# Patient Record
Sex: Female | Born: 1945 | Race: White | Hispanic: No | Marital: Married | Smoking: Former smoker
Health system: Southern US, Community
[De-identification: ages and names within clinical notes are randomized; demographics above are authoritative.]

## PROBLEM LIST (undated history)

## (undated) DIAGNOSIS — E785 Hyperlipidemia, unspecified: Secondary | ICD-10-CM

## (undated) DIAGNOSIS — I6529 Occlusion and stenosis of unspecified carotid artery: Secondary | ICD-10-CM

## (undated) DIAGNOSIS — M810 Age-related osteoporosis without current pathological fracture: Secondary | ICD-10-CM

## (undated) DIAGNOSIS — Z8659 Personal history of other mental and behavioral disorders: Secondary | ICD-10-CM

## (undated) DIAGNOSIS — I10 Essential (primary) hypertension: Secondary | ICD-10-CM

## (undated) HISTORY — PX: LEG SURGERY: SHX1003

## (undated) HISTORY — PX: ABDOMINAL HYSTERECTOMY: SHX81

## (undated) HISTORY — PX: FOOT SURGERY: SHX648

## (undated) HISTORY — PX: TONSILLECTOMY: SUR1361

## (undated) HISTORY — PX: CHOLECYSTECTOMY: SHX55

## (undated) HISTORY — DX: Essential (primary) hypertension: I10

## (undated) HISTORY — DX: Occlusion and stenosis of unspecified carotid artery: I65.29

## (undated) HISTORY — PX: WRIST SURGERY: SHX841

## (undated) HISTORY — DX: Hyperlipidemia, unspecified: E78.5

---

## 2011-04-05 ENCOUNTER — Ambulatory Visit: Payer: Self-pay | Admitting: Ophthalmology

## 2011-04-19 ENCOUNTER — Ambulatory Visit: Payer: Self-pay | Admitting: Ophthalmology

## 2011-10-30 DIAGNOSIS — F32A Depression, unspecified: Secondary | ICD-10-CM | POA: Insufficient documentation

## 2011-10-30 DIAGNOSIS — M810 Age-related osteoporosis without current pathological fracture: Secondary | ICD-10-CM | POA: Insufficient documentation

## 2012-09-17 DIAGNOSIS — B351 Tinea unguium: Secondary | ICD-10-CM | POA: Insufficient documentation

## 2013-06-09 DIAGNOSIS — J209 Acute bronchitis, unspecified: Secondary | ICD-10-CM | POA: Insufficient documentation

## 2013-06-10 DIAGNOSIS — F331 Major depressive disorder, recurrent, moderate: Secondary | ICD-10-CM | POA: Insufficient documentation

## 2013-09-10 DIAGNOSIS — J301 Allergic rhinitis due to pollen: Secondary | ICD-10-CM | POA: Insufficient documentation

## 2013-09-10 DIAGNOSIS — I10 Essential (primary) hypertension: Secondary | ICD-10-CM | POA: Insufficient documentation

## 2013-09-10 DIAGNOSIS — Z6828 Body mass index (BMI) 28.0-28.9, adult: Secondary | ICD-10-CM | POA: Insufficient documentation

## 2013-09-10 DIAGNOSIS — G43019 Migraine without aura, intractable, without status migrainosus: Secondary | ICD-10-CM | POA: Insufficient documentation

## 2013-09-10 DIAGNOSIS — E538 Deficiency of other specified B group vitamins: Secondary | ICD-10-CM | POA: Insufficient documentation

## 2013-12-29 DIAGNOSIS — R233 Spontaneous ecchymoses: Secondary | ICD-10-CM | POA: Insufficient documentation

## 2014-01-13 DIAGNOSIS — M1710 Unilateral primary osteoarthritis, unspecified knee: Secondary | ICD-10-CM | POA: Insufficient documentation

## 2014-01-13 DIAGNOSIS — M179 Osteoarthritis of knee, unspecified: Secondary | ICD-10-CM | POA: Insufficient documentation

## 2014-11-09 DIAGNOSIS — Z9181 History of falling: Secondary | ICD-10-CM | POA: Insufficient documentation

## 2014-11-17 DIAGNOSIS — N390 Urinary tract infection, site not specified: Secondary | ICD-10-CM | POA: Insufficient documentation

## 2014-11-17 DIAGNOSIS — F29 Unspecified psychosis not due to a substance or known physiological condition: Secondary | ICD-10-CM | POA: Insufficient documentation

## 2014-11-30 DIAGNOSIS — M84353A Stress fracture, unspecified femur, initial encounter for fracture: Secondary | ICD-10-CM | POA: Insufficient documentation

## 2014-12-04 DIAGNOSIS — M25532 Pain in left wrist: Secondary | ICD-10-CM | POA: Insufficient documentation

## 2014-12-15 DIAGNOSIS — L821 Other seborrheic keratosis: Secondary | ICD-10-CM | POA: Insufficient documentation

## 2015-05-10 DIAGNOSIS — S72302A Unspecified fracture of shaft of left femur, initial encounter for closed fracture: Secondary | ICD-10-CM | POA: Insufficient documentation

## 2015-05-20 DIAGNOSIS — R339 Retention of urine, unspecified: Secondary | ICD-10-CM | POA: Insufficient documentation

## 2015-05-20 DIAGNOSIS — N398 Other specified disorders of urinary system: Secondary | ICD-10-CM | POA: Insufficient documentation

## 2015-05-20 DIAGNOSIS — N393 Stress incontinence (female) (male): Secondary | ICD-10-CM | POA: Insufficient documentation

## 2015-07-14 DIAGNOSIS — S82831A Other fracture of upper and lower end of right fibula, initial encounter for closed fracture: Secondary | ICD-10-CM | POA: Insufficient documentation

## 2015-08-17 DIAGNOSIS — S82032A Displaced transverse fracture of left patella, initial encounter for closed fracture: Secondary | ICD-10-CM | POA: Insufficient documentation

## 2015-09-13 DIAGNOSIS — I491 Atrial premature depolarization: Secondary | ICD-10-CM | POA: Insufficient documentation

## 2015-10-18 DIAGNOSIS — Z79899 Other long term (current) drug therapy: Secondary | ICD-10-CM | POA: Insufficient documentation

## 2015-12-02 DIAGNOSIS — R748 Abnormal levels of other serum enzymes: Secondary | ICD-10-CM | POA: Insufficient documentation

## 2016-07-03 DIAGNOSIS — L578 Other skin changes due to chronic exposure to nonionizing radiation: Secondary | ICD-10-CM | POA: Insufficient documentation

## 2017-11-22 ENCOUNTER — Ambulatory Visit (INDEPENDENT_AMBULATORY_CARE_PROVIDER_SITE_OTHER): Payer: Medicare Other | Admitting: Vascular Surgery

## 2017-11-22 ENCOUNTER — Encounter (INDEPENDENT_AMBULATORY_CARE_PROVIDER_SITE_OTHER): Payer: Self-pay

## 2017-11-22 ENCOUNTER — Encounter (INDEPENDENT_AMBULATORY_CARE_PROVIDER_SITE_OTHER): Payer: Self-pay | Admitting: Vascular Surgery

## 2017-11-22 VITALS — BP 108/66 | HR 81 | Resp 17 | Ht 66.0 in | Wt 170.0 lb

## 2017-11-22 DIAGNOSIS — I6523 Occlusion and stenosis of bilateral carotid arteries: Secondary | ICD-10-CM | POA: Insufficient documentation

## 2017-11-22 DIAGNOSIS — E785 Hyperlipidemia, unspecified: Secondary | ICD-10-CM | POA: Diagnosis not present

## 2017-11-22 NOTE — Progress Notes (Signed)
Subjective:    Patient ID: ZYAIR RHEIN, female    DOB: 11/07/1946, 71 y.o.   MRN: [id number] Chief Complaint  Patient presents with  . New Patient (Initial Visit)    Carotid Stenosis   Presents as a new patient referred by [facility] primary care in my been for the evaluation of carotid artery stenosis.  The patient endorses a history of both her parents experiencing strokes.  The patient was concerned done during a physical examination a provider auscultated a bruit. This led to a CTA of the neck which as per the patient was notable for 65% stenosis to the bilateral arteries.  The patient states her stenosis was characterized as "beadlike".  The patient was placed on a cholesterol medication and aspirin and encouraged to follow-up in 1 year.  The vascular surgeon has since moved on from [facility] and the patient is now establishing care with [place].  The patient is asymptomatic and denies any amaurosis fugax or neurological deficits.  We called the patient's primary care office and asked for a copy of the CTA to be sent to our office as I was not able to access it in epic.  Patient denies any fever, nausea vomiting.   Review of Systems  Constitutional: Negative.   HENT: Negative.   Eyes: Negative.   Respiratory: Negative.   Cardiovascular:       Carotid stenosis  Gastrointestinal: Negative.   Endocrine: Negative.   Genitourinary: Negative.   Musculoskeletal: Negative.   Skin: Negative.   Allergic/Immunologic: Negative.   Neurological: Negative.   Hematological: Negative.   Psychiatric/Behavioral: Negative.       Objective:   Physical Exam  Constitutional: She is oriented to person, place, and time. She appears well-developed. No distress.  HENT:  Head: Normocephalic and atraumatic.  Eyes: Conjunctivae are normal. Pupils are equal, round, and reactive to light.  Neck: Normal range of motion.  Bilateral bruit noted  Cardiovascular: Normal rate, regular rhythm, normal heart sounds and intact  distal pulses.  Pulses:      Radial pulses are 2+ on the right side, and 2+ on the left side.  Pulmonary/Chest: Effort normal and breath sounds normal.  Musculoskeletal: Normal range of motion. She exhibits no edema.  Neurological: She is alert and oriented to person, place, and time.  Skin: Skin is warm and dry. She is not diaphoretic.  Psychiatric: She has a normal mood and affect. Her behavior is normal. Judgment and thought content normal.  Vitals reviewed.  BP 108/66 (BP Location: Right Arm)   Pulse 81   Resp 17   Ht 5\' 6"  (1.676 m)   Wt 170 lb (77.1 kg)   BMI 27.44 kg/m   No past medical history on file.  Social History   Socioeconomic History  . Marital status: Married    Spouse name: Not on file  . Number of children: Not on file  . Years of education: Not on file  . Highest education level: Not on file  Social Needs  . Financial resource strain: Not on file  . Food insecurity - worry: Not on file  . Food insecurity - inability: Not on file  . Transportation needs - medical: Not on file  . Transportation needs - non-medical: Not on file  Occupational History  . Not on file  Tobacco Use  . Smoking status: Never Smoker  . Smokeless tobacco: Never Used  Substance and Sexual Activity  . Alcohol use: Not on file  . Drug use:  Not on file  . Sexual activity: Not on file  Other Topics Concern  . Not on file  Social History Narrative  . Not on file   No family history on file.  Allergies  Allergen Reactions  . Atorvastatin     Other reaction(s): Muscle Pain  . Risperidone Other (See Comments)    Other reaction(s): Other (See Comments) Hallucinations Other Reaction: Hallucinations Hallucinations Other Reaction: Hallucinations Hallucinations   . Diclofenac Other (See Comments)    Other reaction(s): Other (See Comments) Other Reaction: patch = nausea Other Reaction: patch = nausea   . Prednisone Other (See Comments)    Other reaction(s): Other (See  Comments) Mood Change Mood Change Anxiety   . Codeine Itching  . Rizatriptan Palpitations      Assessment & Plan:  Presents as a new patient referred by [facility] primary care in my been for the evaluation of carotid artery stenosis.  The patient endorses a history of both her parents experiencing strokes.  The patient was concerned done during a physical examination a provider auscultated a bruit. This led to a CTA of the neck which as per the patient was notable for 65% stenosis to the bilateral arteries.  The patient states her stenosis was characterized as "beadlike".  The patient was placed on a cholesterol medication and aspirin and encouraged to follow-up in 1 year.  [facility] and the patient is now establishing care with us.  The patient is asymptomatic and denies any amaurosis fugax or neurological deficits.  We called the patient's primary care office and asked for a copy of the CTA to be sent to our office as I was not able to access it in epic.  Patient denies any fever, nausea vomiting.  1. Bilateral carotid artery stenosis - New Patient with a diagnosis of bilateral carotid artery stenosis. [facility] however the vascular surgeon is no longer there The patient is now establishing care with us The CTA results were not included in the patient's referral paperwork and I was unable to obtain them on epic A call was placed to the patient's primary care office to have a CTA faxed over The CTA of the neck was conducted approximately 1 year ago and I find it reasonable to order an ultrasound to assess for any worsening carotid artery disease The patient to follow-up with me or the physician after her carotid duplex  - VAS US CAROTID; Future  2. Hyperlipidemia, unspecified hyperlipidemia type - Stable Encouraged good control as its slows the progression of atherosclerotic disease  Current Outpatient  Medications on File Prior to Visit  Medication Sig Dispense Refill  . aspirin EC 81 MG tablet Take by mouth.    . Calcium Ascorbate 500 MG TABS Take by mouth.    . Calcium Carbonate-Vitamin D (CALCIUM 500/D PO) Take 500 mg by mouth.    Marland Kitchen. CALCIUM PO Take by mouth.    . clorazepate (TRANXENE) 15 MG tablet 1 tab by mouth 3 times a day & 2 tab at bedtime    . clotrimazole (LOTRIMIN) 1 % cream     . Cranberry 450 MG TABS Take by mouth.    . cyanocobalamin (,VITAMIN B-12,) 1000 MCG/ML injection Inject into the muscle.    . cyclobenzaprine (FLEXERIL) 10 MG tablet Take 10 mg by mouth.    . Diethylpropion HCl CR 75 MG TB24 Take 1 tablet by  mouth 3 (three) times daily.  5  . Ergocalciferol (VITAMIN D2) 2000 units TABS Take by mouth.    . escitalopram (LEXAPRO) 20 MG tablet Take by mouth.    . estradiol (ESTRACE) 0.1 MG/GM vaginal cream Place vaginally.    . fexofenadine (ALLEGRA) 180 MG tablet Take by mouth.    . fluticasone (FLONASE) 50 MCG/ACT nasal spray Place into the nose.    . lactulose (CHRONULAC) 10 GM/15ML solution prn as needed    . lamoTRIgine (LAMICTAL) 100 MG tablet     . lamoTRIgine (LAMICTAL) 200 MG tablet Take 200 mg by mouth.    . magnesium oxide (MAG-OX) 400 MG tablet Take by mouth.    . meclizine (ANTIVERT) 25 MG tablet Take by mouth.    . naproxen (NAPROSYN) 500 MG tablet Take 500 mg by mouth.    Marland Kitchen. omeprazole (PRILOSEC) 20 MG capsule Take by mouth.    . ondansetron (ZOFRAN) 4 MG tablet Take by mouth.    . oxybutynin (DITROPAN-XL) 10 MG 24 hr tablet Take by mouth.    . promethazine (PHENERGAN) 25 MG tablet Take by mouth.    . ramelteon (ROZEREM) 8 MG tablet Reported on 04/03/2016    . risperiDONE (RISPERDAL) 0.25 MG tablet Take by mouth.    . simvastatin (ZOCOR) 20 MG tablet Take by mouth.    . Teriparatide, Recombinant, (FORTEO) 600 MCG/2.4ML SOLN Inject into the skin.    Marland Kitchen. ziprasidone (GEODON) 40 MG capsule 80 mg. Pt takes 2 tablets daily    . zolpidem (AMBIEN) 10 MG tablet  Take 20 mg by mouth.    . zonisamide (ZONEGRAN) 100 MG capsule 2 (two) times daily.    Marland Kitchen. amphetamine-dextroamphetamine (ADDERALL) 30 MG tablet Take 30 mg by mouth.     No current facility-administered medications on file prior to visit.    There are no Patient Instructions on file for this visit. No Follow-up on file.  Whit Bruni A Mattias Walmsley, PA-C

## 2017-11-28 ENCOUNTER — Encounter (INDEPENDENT_AMBULATORY_CARE_PROVIDER_SITE_OTHER): Payer: Self-pay

## 2017-12-27 ENCOUNTER — Ambulatory Visit (INDEPENDENT_AMBULATORY_CARE_PROVIDER_SITE_OTHER): Payer: Medicare Other

## 2017-12-27 ENCOUNTER — Ambulatory Visit (INDEPENDENT_AMBULATORY_CARE_PROVIDER_SITE_OTHER): Payer: Medicare Other | Admitting: Vascular Surgery

## 2017-12-27 ENCOUNTER — Encounter (INDEPENDENT_AMBULATORY_CARE_PROVIDER_SITE_OTHER): Payer: Self-pay | Admitting: Vascular Surgery

## 2017-12-27 VITALS — BP 124/76 | HR 69 | Resp 14 | Ht 66.0 in | Wt 172.0 lb

## 2017-12-27 DIAGNOSIS — K219 Gastro-esophageal reflux disease without esophagitis: Secondary | ICD-10-CM

## 2017-12-27 DIAGNOSIS — E785 Hyperlipidemia, unspecified: Secondary | ICD-10-CM

## 2017-12-27 DIAGNOSIS — I6523 Occlusion and stenosis of bilateral carotid arteries: Secondary | ICD-10-CM

## 2017-12-27 NOTE — Progress Notes (Signed)
MRN : [id number]  Andrea RuddleMyra O Sims is a 72 y.o. (09/24/1946) female who presents with chief complaint of  Chief Complaint  Patient presents with  . Follow-up    Carotid u/s f/u  .  History of Present Illness: The patient is seen for follow up evaluation of carotid stenosis. The carotid stenosis followed by ultrasound.   The patient denies amaurosis fugax. There is no recent history of TIA symptoms or focal motor deficits. There is no prior documented CVA.  The patient is taking enteric-coated aspirin 81 mg daily.  There is no history of migraine headaches. There is no history of seizures.  The patient has a history of coronary artery disease, no recent episodes of angina or shortness of breath. The patient denies PAD or claudication symptoms. There is a history of hyperlipidemia which is being treated with a statin.      Current Meds  Medication Sig  . amphetamine-dextroamphetamine (ADDERALL) 30 MG tablet Take 30 mg by mouth.  Marland Kitchen. aspirin EC 81 MG tablet Take by mouth.  . Calcium Carbonate-Vitamin D (CALCIUM 500/D PO) Take 500 mg by mouth.  . clorazepate (TRANXENE) 15 MG tablet 1 tab by mouth 3 times a day & 2 tab at bedtime  . clotrimazole (LOTRIMIN) 1 % cream   . Cranberry 450 MG TABS Take by mouth.  . cyanocobalamin (,VITAMIN B-12,) 1000 MCG/ML injection Inject into the muscle.  . cyclobenzaprine (FLEXERIL) 10 MG tablet Take 10 mg by mouth.  . diclofenac sodium (VOLTAREN) 1 % GEL APPLY TOPICALLY ON LEG FOUR TIMES A DAY AS NEEDED  . Diethylpropion HCl CR 75 MG TB24 Take 1 tablet by mouth 3 (three) times daily.  . Ergocalciferol (VITAMIN D2) 2000 units TABS Take by mouth.  . escitalopram (LEXAPRO) 20 MG tablet Take by mouth.  . estradiol (ESTRACE) 0.1 MG/GM vaginal cream Place vaginally.  . fexofenadine (ALLEGRA) 180 MG tablet Take by mouth.  . fluticasone (FLONASE) 50 MCG/ACT nasal spray Place into the nose.  . lactulose (CHRONULAC) 10 GM/15ML solution prn as needed  .  lamoTRIgine (LAMICTAL) 100 MG tablet   . magnesium oxide (MAG-OX) 400 MG tablet Take by mouth.  . naproxen (NAPROSYN) 500 MG tablet Take 500 mg by mouth.  . ondansetron (ZOFRAN) 4 MG tablet Take by mouth.  Marland Kitchen. PROCTOSOL HC 2.5 % rectal cream APPLY TO AFFECTED AREA 3 TIMES A DAY  . promethazine (PHENERGAN) 25 MG tablet Take by mouth.  . ramelteon (ROZEREM) 8 MG tablet Reported on 04/03/2016  . risperiDONE (RISPERDAL) 0.25 MG tablet Take by mouth.  . simvastatin (ZOCOR) 20 MG tablet Take by mouth.  . Teriparatide, Recombinant, (FORTEO) 600 MCG/2.4ML SOLN Inject into the skin.  Marland Kitchen. ziprasidone (GEODON) 40 MG capsule 80 mg. Pt takes 2 tablets daily  . zolpidem (AMBIEN) 10 MG tablet Take 20 mg by mouth.  . zonisamide (ZONEGRAN) 100 MG capsule 2 (two) times daily.  . [DISCONTINUED] Calcium Ascorbate 500 MG TABS Take by mouth.  . [DISCONTINUED] CALCIUM PO Take by mouth.  . [DISCONTINUED] lamoTRIgine (LAMICTAL) 200 MG tablet Take 200 mg by mouth.    Past Medical History:  Diagnosis Date  . Carotid artery occlusion   . Hyperlipidemia   . Hypertension     No past surgical history on file.  Social History Social History   Tobacco Use  . Smoking status: Never Smoker  . Smokeless tobacco: Never Used  Substance Use Topics  . Alcohol use: Not on file  . Drug use:  Not on file    Family History No family history on file.  Allergies  Allergen Reactions  . Atorvastatin     Other reaction(s): Muscle Pain  . Risperidone Other (See Comments)    Other reaction(s): Other (See Comments) Hallucinations Other Reaction: Hallucinations Hallucinations Other Reaction: Hallucinations Hallucinations   . Diclofenac Other (See Comments)    Other reaction(s): Other (See Comments) Other Reaction: patch = nausea Other Reaction: patch = nausea   . Prednisone Other (See Comments)    Other reaction(s): Other (See Comments) Mood Change Mood Change Anxiety   . Codeine Itching  . Rizatriptan  Palpitations     REVIEW OF SYSTEMS (Negative unless checked)  Constitutional: [] Weight loss  [] Fever  [] Chills Cardiac: [] Chest pain   [] Chest pressure   [] Palpitations   [] Shortness of breath when laying flat   [] Shortness of breath with exertion. Vascular:  [] Pain in legs with walking   [] Pain in legs at rest  [] History of DVT   [] Phlebitis   [] Swelling in legs   [] Varicose veins   [] Non-healing ulcers Pulmonary:   [] Uses home oxygen   [] Productive cough   [] Hemoptysis   [] Wheeze  [] COPD   [] Asthma Neurologic:  [] Dizziness   [] Seizures   [] History of stroke   [] History of TIA  [] Aphasia   [] Vissual changes   [] Weakness or numbness in arm   [] Weakness or numbness in leg Musculoskeletal:   [] Joint swelling   [] Joint pain   [] Low back pain Hematologic:  [] Easy bruising  [] Easy bleeding   [] Hypercoagulable state   [] Anemic Gastrointestinal:  [] Diarrhea   [] Vomiting  [] Gastroesophageal reflux/heartburn   [] Difficulty swallowing. Genitourinary:  [] Chronic kidney disease   [] Difficult urination  [] Frequent urination   [] Blood in urine Skin:  [] Rashes   [] Ulcers  Psychological:  [] History of anxiety   []  History of major depression.  Physical Examination  Vitals:   12/27/17 1445 12/27/17 1446  BP: 123/73 124/76  Pulse: 71 69  Resp: 14   Weight: 172 lb (78 kg)   Height: 5\' 6"  (1.676 m)    Body mass index is 27.76 kg/m. Gen: WD/WN, NAD Head: [place]/AT, No temporalis wasting.  Ear/Nose/Throat: Hearing grossly intact, nares w/o erythema or drainage Eyes: PER, EOMI, sclera nonicteric.  Neck: Supple, no large masses.   Pulmonary:  Good air movement, no audible wheezing bilaterally, no use of accessory muscles.  Cardiac: RRR, no JVD Vascular: no bruits Vessel Right Left  Radial Palpable Palpable  Ulnar Palpable Palpable  Brachial Palpable Palpable  Carotid Palpable Palpable  Gastrointestinal: Non-distended. No guarding/no peritoneal signs.  Musculoskeletal: M/S 5/5 throughout.  No  deformity or atrophy.  Neurologic: CN 2-12 intact. Symmetrical.  Speech is fluent. Motor exam as listed above. Psychiatric: Judgment intact, Mood & affect appropriate for pt's clinical situation. Dermatologic: No rashes or ulcers noted.  No changes consistent with cellulitis. Lymph : No lichenification or skin changes of chronic lymphedema.  CBC No results found for: WBC, HGB, HCT, MCV, PLT  BMET No results found for: NA, K, CL, CO2, GLUCOSE, BUN, CREATININE, CALCIUM, GFRNONAA, GFRAA CrCl cannot be calculated (No order found.).  COAG No results found for: INR, PROTIME  Radiology No results found.   Assessment/Plan 1. Bilateral carotid artery stenosis Recommend:  Given the patient's asymptomatic subcritical stenosis no further invasive testing or surgery at this time.  Duplex ultrasound shows <30% stenosis bilaterally.  Continue antiplatelet therapy as prescribed Continue management of CAD, HTN and Hyperlipidemia Healthy heart diet,  encouraged exercise at  least 4 times per week Follow up in 12 months with duplex ultrasound and physical exam based on <50% stenosis of the bilateral carotid artery   2. Hyperlipidemia, unspecified hyperlipidemia type Continue antihypertensive medications as already ordered, these medications have been reviewed and there are no changes at this time.   3. Gastroesophageal reflux disease without esophagitis Continue PPI as already ordered, these medications have been reviewed and there are no changes at this time.     Levora Dredge, MD  12/27/2017 3:36 PM

## 2018-12-30 ENCOUNTER — Ambulatory Visit (INDEPENDENT_AMBULATORY_CARE_PROVIDER_SITE_OTHER): Payer: Medicare Other | Admitting: Vascular Surgery

## 2018-12-30 ENCOUNTER — Encounter (INDEPENDENT_AMBULATORY_CARE_PROVIDER_SITE_OTHER): Payer: Medicare Other

## 2019-09-16 DIAGNOSIS — G5602 Carpal tunnel syndrome, left upper limb: Secondary | ICD-10-CM | POA: Insufficient documentation

## 2019-09-16 DIAGNOSIS — S52502A Unspecified fracture of the lower end of left radius, initial encounter for closed fracture: Secondary | ICD-10-CM | POA: Insufficient documentation

## 2020-01-27 ENCOUNTER — Other Ambulatory Visit: Payer: Self-pay | Admitting: Family Medicine

## 2020-01-27 DIAGNOSIS — I6523 Occlusion and stenosis of bilateral carotid arteries: Secondary | ICD-10-CM

## 2020-02-05 ENCOUNTER — Ambulatory Visit: Payer: Medicare Other

## 2020-02-18 ENCOUNTER — Ambulatory Visit: Payer: Medicare Other

## 2020-03-08 ENCOUNTER — Other Ambulatory Visit: Payer: Self-pay

## 2020-03-08 ENCOUNTER — Encounter (INDEPENDENT_AMBULATORY_CARE_PROVIDER_SITE_OTHER): Payer: Self-pay | Admitting: Vascular Surgery

## 2020-03-08 ENCOUNTER — Ambulatory Visit (INDEPENDENT_AMBULATORY_CARE_PROVIDER_SITE_OTHER): Payer: Medicare Other | Admitting: Vascular Surgery

## 2020-03-08 VITALS — BP 115/73 | HR 89 | Resp 16 | Ht 65.5 in | Wt 179.6 lb

## 2020-03-08 DIAGNOSIS — M545 Low back pain, unspecified: Secondary | ICD-10-CM | POA: Insufficient documentation

## 2020-03-08 DIAGNOSIS — E785 Hyperlipidemia, unspecified: Secondary | ICD-10-CM

## 2020-03-08 DIAGNOSIS — M542 Cervicalgia: Secondary | ICD-10-CM | POA: Insufficient documentation

## 2020-03-08 DIAGNOSIS — K219 Gastro-esophageal reflux disease without esophagitis: Secondary | ICD-10-CM

## 2020-03-08 DIAGNOSIS — I6523 Occlusion and stenosis of bilateral carotid arteries: Secondary | ICD-10-CM | POA: Diagnosis not present

## 2020-03-08 DIAGNOSIS — M25569 Pain in unspecified knee: Secondary | ICD-10-CM | POA: Insufficient documentation

## 2020-03-08 DIAGNOSIS — S92309A Fracture of unspecified metatarsal bone(s), unspecified foot, initial encounter for closed fracture: Secondary | ICD-10-CM | POA: Insufficient documentation

## 2020-03-08 NOTE — Progress Notes (Signed)
MRN : [id number]  Andrea Sims is a 74 y.o. (Mar 18, 1946) female who presents with chief complaint of  Chief Complaint  Patient presents with  . Follow-up    ref Andrea Sims carotid stenosis  .  History of Present Illness:   The patient is seen for follow up evaluation of carotid stenosis. The carotid stenosis followed by ultrasound.   The patient denies amaurosis fugax. There is no recent history of TIA symptoms or focal motor deficits. There is no prior documented CVA.  The patient is taking enteric-coated aspirin 81 mg daily.  There is no history of migraine headaches. There is no history of seizures.  The patient has a history of coronary artery disease, no recent episodes of angina or shortness of breath. The patient denies PAD or claudication symptoms. There is a history of hyperlipidemia which is being treated with a statin.   Previous duplex ultrasound of the carotid arteries shows <40% bilateral ICA stenosis, tortuous ICA noted.  No outpatient medications have been marked as taking for the 03/08/20 encounter (Office Visit) with Andrea Sims, Andrea Craver, MD.    Past Medical History:  Diagnosis Date  . Carotid artery occlusion   . Hyperlipidemia   . Hypertension     No past surgical history on file.  Social History Social History   Tobacco Use  . Smoking status: Never Smoker  . Smokeless tobacco: Never Used  Substance Use Topics  . Alcohol use: Not on file  . Drug use: Not on file    Family History No family history on file.  Allergies  Allergen Reactions  . Atorvastatin     Other reaction(s): Muscle Pain  . Risperidone Other (See Comments)    Other reaction(s): Other (See Comments) Hallucinations Other Reaction: Hallucinations Hallucinations Other Reaction: Hallucinations Hallucinations   . Diclofenac Other (See Comments)    Other reaction(s): Other (See Comments) Other Reaction: patch = nausea Other Reaction: patch = nausea   . Prednisone Other  (See Comments)    Other reaction(s): Other (See Comments) Mood Change Mood Change Anxiety   . Codeine Itching  . Rizatriptan Palpitations     REVIEW OF SYSTEMS (Negative unless checked)  Constitutional: [] Weight loss  [] Fever  [] Chills Cardiac: [] Chest pain   [] Chest pressure   [] Palpitations   [] Shortness of breath when laying flat   [] Shortness of breath with exertion. Vascular:  [] Pain in legs with walking   [] Pain in legs at rest  [] History of DVT   [] Phlebitis   [] Swelling in legs   [] Varicose veins   [] Non-healing ulcers Pulmonary:   [] Uses home oxygen   [] Productive cough   [] Hemoptysis   [] Wheeze  [] COPD   [] Asthma Neurologic:  [] Dizziness   [] Seizures   [] History of stroke   [] History of TIA  [] Aphasia   [] Vissual changes   [] Weakness or numbness in arm   [] Weakness or numbness in leg Musculoskeletal:   [] Joint swelling   [] Joint pain   [] Low back pain Hematologic:  [] Easy bruising  [] Easy bleeding   [] Hypercoagulable state   [] Anemic Gastrointestinal:  [] Diarrhea   [] Vomiting  [x] Gastroesophageal reflux/heartburn   [] Difficulty swallowing. Genitourinary:  [] Chronic kidney disease   [] Difficult urination  [] Frequent urination   [] Blood in urine Skin:  [] Rashes   [] Ulcers  Psychological:  [] History of anxiety   []  History of major depression.  Physical Examination  Vitals:   03/08/20 1113 03/08/20 1114  BP: 109/68 115/73  Pulse: 89   Resp: 16   Weight:  179 lb 9.6 oz (81.5 kg)   Height: 5' 5.5" (1.664 m)    Body mass index is 29.43 kg/m. Gen: WD/WN, NAD Head: [place]/AT, No temporalis wasting.  Ear/Nose/Throat: Hearing grossly intact, nares w/o erythema or drainage Eyes: PER, EOMI, sclera nonicteric.  Neck: Supple, no large masses.   Pulmonary:  Good air movement, no audible wheezing bilaterally, no use of accessory muscles.  Cardiac: RRR, no JVD Vascular: soft carotid bruits bilaterally Vessel Right Left  Radial Palpable Palpable  Carotid Palpable Palpable    Gastrointestinal: Non-distended. No guarding/no peritoneal signs.  Musculoskeletal: M/S 5/5 throughout.  No deformity or atrophy.  Neurologic: CN 2-12 intact. Symmetrical.  Speech is fluent. Motor exam as listed above. Psychiatric: Judgment intact, Mood & affect appropriate for pt's clinical situation. Dermatologic: No rashes or ulcers noted.  No changes consistent with cellulitis.  CBC No results found for: WBC, HGB, HCT, MCV, PLT  BMET No results found for: NA, K, CL, CO2, GLUCOSE, BUN, CREATININE, CALCIUM, GFRNONAA, GFRAA CrCl cannot be calculated (No successful lab value found.).  COAG No results found for: INR, PROTIME  Radiology No results found.   Assessment/Plan 1. Bilateral carotid artery stenosis Recommend:  Given the patient's asymptomaticcarotid stenosis no  invasive testing or surgery at this time.  Previous duplex ultrasound shows <40% stenosis bilaterally.  Patient is overdue for her follow-up ultrasound and this will be ordered.  She will follow-up with me to review the results.  Continue antiplatelet therapy as prescribed Continue management of CAD, HTN and Hyperlipidemia Healthy heart diet,  encouraged exercise at least 4 times per week Follow up in 1 months with duplex ultrasound and physical exam  - VAS US CAROTID; Future  2. Gastroesophageal reflux disease without esophagitis Continue PPI as already ordered, this medication has been reviewed and there are no changes at this time.  Avoidence of caffeine and alcohol  Moderate elevation of the head of the bed   3. Hyperlipidemia, unspecified hyperlipidemia type Continue statin as ordered and reviewed, no changes at this time     Andrea Pilar, MD  03/08/2020 11:16 AM

## 2020-03-15 ENCOUNTER — Encounter (INDEPENDENT_AMBULATORY_CARE_PROVIDER_SITE_OTHER): Payer: Self-pay | Admitting: Nurse Practitioner

## 2020-03-15 ENCOUNTER — Ambulatory Visit (INDEPENDENT_AMBULATORY_CARE_PROVIDER_SITE_OTHER): Payer: Medicare Other | Admitting: Nurse Practitioner

## 2020-03-15 ENCOUNTER — Ambulatory Visit (INDEPENDENT_AMBULATORY_CARE_PROVIDER_SITE_OTHER): Payer: Medicare Other

## 2020-03-15 ENCOUNTER — Other Ambulatory Visit: Payer: Self-pay

## 2020-03-15 ENCOUNTER — Ambulatory Visit (INDEPENDENT_AMBULATORY_CARE_PROVIDER_SITE_OTHER): Payer: Medicare Other | Admitting: Vascular Surgery

## 2020-03-15 VITALS — BP 132/79 | HR 96 | Resp 16 | Wt 194.8 lb

## 2020-03-15 DIAGNOSIS — I6523 Occlusion and stenosis of bilateral carotid arteries: Secondary | ICD-10-CM

## 2020-03-15 DIAGNOSIS — E785 Hyperlipidemia, unspecified: Secondary | ICD-10-CM | POA: Diagnosis not present

## 2020-03-15 DIAGNOSIS — I10 Essential (primary) hypertension: Secondary | ICD-10-CM | POA: Diagnosis not present

## 2020-03-16 ENCOUNTER — Encounter (INDEPENDENT_AMBULATORY_CARE_PROVIDER_SITE_OTHER): Payer: Self-pay | Admitting: Nurse Practitioner

## 2020-03-16 NOTE — Progress Notes (Signed)
SUBJECTIVE:  Patient ID: Andrea Sims, female    DOB: April 12, 1946, 74 y.o.   MRN: [id number] Chief Complaint  Patient presents with  . Follow-up    ultrasound follow up    HPI  Andrea EMRY TOBIN is a 74 y.o. female The patient is seen for follow up evaluation of carotid stenosis. The carotid stenosis followed by ultrasound.   The patient denies amaurosis fugax. There is no recent history of TIA symptoms or focal motor deficits. There is no prior documented CVA.  The patient is taking enteric-coated aspirin 81 mg daily.  There is no history of migraine headaches. There is no history of seizures.  The patient has a history of coronary artery disease, no recent episodes of angina or shortness of breath. The patient denies PAD or claudication symptoms. There is a history of hyperlipidemia which is being treated with a statin.    Carotid Duplex done today shows 1 to 39% stenosis within the right internal carotid artery and 40 to 59% in the left.  Bilateral vertebral arteries have antegrade flow as well as normal flow hemodynamics in the bilateral subclavian arteries.  There is a slight increase in the left stenosis compared to the previous study on 12/27/2017.  Past Medical History:  Diagnosis Date  . Carotid artery occlusion   . Hyperlipidemia   . Hypertension     Past Surgical History:  Procedure Laterality Date  . ABDOMINAL HYSTERECTOMY    . CHOLECYSTECTOMY    . FOOT SURGERY Left   . LEG SURGERY Left   . TONSILLECTOMY    . WRIST SURGERY Left     Social History   Socioeconomic History  . Marital status: Married    Spouse name: Not on file  . Number of children: Not on file  . Years of education: Not on file  . Highest education level: Not on file  Occupational History  . Not on file  Tobacco Use  . Smoking status: Never Smoker  . Smokeless tobacco: Never Used  Substance and Sexual Activity  . Alcohol use: Yes    Comment: ocassionally  . Drug use: Never  . Sexual  activity: Not on file  Other Topics Concern  . Not on file  Social History Narrative  . Not on file   Social Determinants of Health   Financial Resource Strain:   . Difficulty of Paying Living Expenses:   Food Insecurity:   . Worried About Charity fundraiser in the Last Year:   . Arboriculturist in the Last Year:   Transportation Needs:   . Film/video editor (Medical):   Marland Kitchen Lack of Transportation (Non-Medical):   Physical Activity:   . Days of Exercise per Week:   . Minutes of Exercise per Session:   Stress:   . Feeling of Stress :   Social Connections:   . Frequency of Communication with Friends and Family:   . Frequency of Social Gatherings with Friends and Family:   . Attends Religious Services:   . Active Member of Clubs or Organizations:   . Attends Archivist Meetings:   Marland Kitchen Marital Status:   Intimate Partner Violence:   . Fear of Current or Ex-Partner:   . Emotionally Abused:   Marland Kitchen Physically Abused:   . Sexually Abused:     Family History  Problem Relation Age of Onset  . Hypertension Mother   . Stroke Father   . Heart attack Father  Allergies  Allergen Reactions  . Atorvastatin     Other reaction(s): Muscle Pain  . Risperidone Other (See Comments)    Other reaction(s): Other (See Comments) Hallucinations Other Reaction: Hallucinations Hallucinations Other Reaction: Hallucinations Hallucinations   . Diclofenac Other (See Comments)    Other reaction(s): Other (See Comments) Other Reaction: patch = nausea Other Reaction: patch = nausea   . Prednisone Other (See Comments)    Other reaction(s): Other (See Comments) Mood Change Mood Change Anxiety   . Codeine Itching  . Rizatriptan Palpitations     Review of Systems   Review of Systems: Negative Unless Checked Constitutional: [] Weight loss  [] Fever  [] Chills Cardiac: [] Chest pain   []  Atrial Fibrillation  [] Palpitations   [] Shortness of breath when laying flat   [] Shortness of  breath with exertion. [] Shortness of breath at rest Vascular:  [] Pain in legs with walking   [] Pain in legs with standing [] Pain in legs when laying flat   [] Claudication    [] Pain in feet when laying flat    [] History of DVT   [] Phlebitis   [] Swelling in legs   [] Varicose veins   [] Non-healing ulcers Pulmonary:   [] Uses home oxygen   [] Productive cough   [] Hemoptysis   [] Wheeze  [] COPD   [] Asthma Neurologic:  [] Dizziness   [] Seizures  [] Blackouts [] History of stroke   [] History of TIA  [] Aphasia   [] Temporary Blindness   [] Weakness or numbness in arm   [] Weakness or numbness in leg Musculoskeletal:   [] Joint swelling   [] Joint pain   [x] Low back pain  []  History of Knee Replacement [x] Arthritis [] back Surgeries  []  Spinal Stenosis    Hematologic:  [] Easy bruising  [] Easy bleeding   [] Hypercoagulable state   [] Anemic Gastrointestinal:  [] Diarrhea   [] Vomiting  [x] Gastroesophageal reflux/heartburn   [] Difficulty swallowing. [] Abdominal pain Genitourinary:  [] Chronic kidney disease   [] Difficult urination  [] Anuric   [] Blood in urine [] Frequent urination  [] Burning with urination   [] Hematuria Skin:  [] Rashes   [] Ulcers [] Wounds Psychological:  [] History of anxiety   [x]  History of major depression  []  Memory Difficulties      OBJECTIVE:   Physical Exam  BP 132/79 (BP Location: Left Arm)   Pulse 96   Resp 16   Wt 194 lb 12.8 oz (88.4 kg)   BMI 31.92 kg/m   Gen: WD/WN, NAD Head: [place]/AT, No temporalis wasting.  Ear/Nose/Throat: Hearing grossly intact, nares w/o erythema or drainage Eyes: PER, EOMI, sclera nonicteric.  Neck: Supple, no masses.  No JVD.  Pulmonary:  Good air movement, no use of accessory muscles.  Cardiac: RRR Vascular:  No carotid bruit Vessel Right Left  Radial Palpable Palpable   Gastrointestinal: soft, non-distended. No guarding/no peritoneal signs.  Musculoskeletal: M/S 5/5 throughout.  No deformity or atrophy.  Neurologic: Pain and light touch intact in  extremities.  Symmetrical.  Speech is fluent. Motor exam as listed above. Psychiatric: Judgment intact, Mood & affect appropriate for pt's clinical situation.        ASSESSMENT AND PLAN:  1. Bilateral carotid artery stenosis Recommend:  Given the patient's asymptomatic subcritical stenosis no further invasive testing or surgery at this time.  Carotid Duplex done today shows 1 to 39% stenosis within the right internal carotid artery and 40 to 59% in the lef  Continue antiplatelet therapy as prescribed Continue management of CAD, HTN and Hyperlipidemia Healthy heart diet,  encouraged exercise at least 4 times per week Follow up in 12 months with duplex  ultrasound and physical exam   2. Benign essential hypertension Continue antihypertensive medications as already ordered, these medications have been reviewed and there are no changes at this time.   3. Hyperlipidemia, unspecified hyperlipidemia type Continue statin as ordered and reviewed, no changes at this time    Current Outpatient Medications on File Prior to Visit  Medication Sig Dispense Refill  . acetaminophen (TYLENOL) 500 MG tablet Take 500 mg by mouth every 6 (six) hours as needed.    Marland Kitchen aspirin EC 81 MG tablet Take by mouth.    . Cholecalciferol 25 MCG (1000 UT) tablet Take by mouth.    . clorazepate (TRANXENE) 15 MG tablet 7.5 mg.     . Cranberry 450 MG TABS Take by mouth.    . cyclobenzaprine (FLEXERIL) 10 MG tablet Take 10 mg by mouth.    . diclofenac sodium (VOLTAREN) 1 % GEL APPLY TOPICALLY ON LEG FOUR TIMES A DAY AS NEEDED  11  . Diethylpropion HCl CR 75 MG TB24 Take 1 tablet by mouth 3 (three) times daily.  5  . estradiol (ESTRACE) 0.1 MG/GM vaginal cream Place vaginally.    . fexofenadine (ALLEGRA) 180 MG tablet Take by mouth.    . fluticasone (FLONASE) 50 MCG/ACT nasal spray Place into the nose.    . lactulose (CHRONULAC) 10 GM/15ML solution prn as needed    . lamoTRIgine (LAMICTAL) 100 MG tablet     .  omeprazole (PRILOSEC) 20 MG capsule Take by mouth.    . raloxifene (EVISTA) 60 MG tablet raloxifene 60 mg tablet    . simvastatin (ZOCOR) 20 MG tablet Take by mouth.    Marland Kitchen UNABLE TO FIND Med Name: estradiol .1mg /ml vb cream  INSERT 4 CLICKS (= ) VAGINALLY AT BEDTIME TWO TIMES PER WEEK    . ziprasidone (GEODON) 40 MG capsule 80 mg. Pt takes 2 tablets daily    . zonisamide (ZONEGRAN) 100 MG capsule 2 (two) times daily.    amphetamine-dextroamphetamine (ADDERALL) 30 MG tablet Take 30 mg by mouth.    . Calcium Carbonate-Vitamin D (CALCIUM 500/D PO) Take 500 mg by mouth.    . clotrimazole (LOTRIMIN) 1 % cream     . cyanocobalamin (,VITAMIN B-12,) 1000 MCG/ML injection Inject into the muscle.    . Ergocalciferol (VITAMIN D2) 2000 units TABS Take by mouth.    . escitalopram (LEXAPRO) 20 MG tablet Take by mouth.    . hydrocortisone 2.5 % cream Place rectally.    . magnesium oxide (MAG-OX) 400 MG tablet Take by mouth.    . naproxen (NAPROSYN) 500 MG tablet Take 500 mg by mouth.    . ondansetron (ZOFRAN) 4 MG tablet Take by mouth.    Marland Kitchen PROCTOSOL HC 2.5 % rectal cream APPLY TO AFFECTED AREA 3 TIMES A DAY  0  . promethazine (PHENERGAN) 25 MG tablet Take by mouth.    . ramelteon (ROZEREM) 8 MG tablet Reported on 04/03/2016    . risperiDONE (RISPERDAL) 0.25 MG tablet Take by mouth.    . solifenacin (VESICARE) 5 MG tablet Take 5 mg by mouth daily.    06/03/2016 zolpidem (AMBIEN) 10 MG tablet Take 20 mg by mouth.     No current facility-administered medications on file prior to visit.    There are no Patient Instructions on file for this visit. No follow-ups on file.   Marland Kitchen, NP  This note was completed with Georgiana Spinner.  Any errors are purely unintentional.

## 2020-06-04 ENCOUNTER — Other Ambulatory Visit: Payer: Self-pay

## 2020-06-04 ENCOUNTER — Encounter: Payer: Self-pay | Admitting: Emergency Medicine

## 2020-06-04 ENCOUNTER — Ambulatory Visit
Admission: EM | Admit: 2020-06-04 | Discharge: 2020-06-04 | Disposition: A | Discharge status: Home / Self Care | Payer: Medicare Other | Attending: Family Medicine | Admitting: Family Medicine

## 2020-06-04 DIAGNOSIS — J0110 Acute frontal sinusitis, unspecified: Secondary | ICD-10-CM | POA: Diagnosis not present

## 2020-06-04 DIAGNOSIS — H6503 Acute serous otitis media, bilateral: Secondary | ICD-10-CM | POA: Diagnosis not present

## 2020-06-04 HISTORY — DX: Age-related osteoporosis without current pathological fracture: M81.0

## 2020-06-04 HISTORY — DX: Personal history of other mental and behavioral disorders: Z86.59

## 2020-06-04 MED ORDER — AMOXICILLIN 875 MG PO TABS: 875.0000 mg | ORAL_TABLET | Freq: Two times a day (BID) | ORAL | 0 refills | Status: AC

## 2020-06-04 NOTE — ED Triage Notes (Signed)
Patient in today c/o sinus pressure/pain, body aches, headache and chills. Patient has taken OTC Aleve and Excedrin, last dose ~7:30am this morning. Patient hasn't taken her temperature.   Patient has had both Moderna covid vaccines, the last was the beginning of May.

## 2020-06-04 NOTE — ED Provider Notes (Signed)
[facility]    CSN: [id number] Arrival date & time: 06/04/20  1324      History   Chief Complaint Chief Complaint  Patient presents with  . Sinus Problem  . Generalized Body Aches  . Chills  . Headache    HPI Andrea Sims is a 74 y.o. female.   74 yo female with a c/o sinus pressure/headache, earache, chills, congestion, cough for the past week. Denies any chest pains, shortness of breath, drainage, wheezing.    Sinus Problem Associated symptoms include headaches.  Headache   Past Medical History:  Diagnosis Date  . Carotid artery occlusion   . History of depression   . Hyperlipidemia   . Hypertension   . Osteoporosis     Patient Active Problem List   Diagnosis Date Noted  . Closed fracture of metatarsal bone 03/08/2020  . Knee pain 03/08/2020  . Low back pain 03/08/2020  . Neck pain 03/08/2020  . Carpal tunnel syndrome of left wrist 09/16/2019  . Closed fracture of left distal radius 09/16/2019  . GERD (gastroesophageal reflux disease) 12/27/2017  . Bilateral carotid artery stenosis 11/22/2017  . Hyperlipidemia 11/22/2017  . Sun-damaged skin 07/03/2016  . Alkaline phosphatase elevation 12/02/2015  . High risk medication use 10/18/2015  . Premature atrial contractions 09/13/2015  . Closed displaced transverse fracture of left patella 08/17/2015  . Fracture of fibula, distal, right, closed 07/14/2015  . Dysfunctional voiding of urine 05/20/2015  . Incomplete emptying of bladder 05/20/2015  . Urinary, incontinence, stress female 05/20/2015  . Closed fracture of shaft of left femur (HCC) 05/10/2015  . Keratosis, seborrheic 12/15/2014  . Wrist pain, acute, left 12/04/2014  . Stress fracture of femur 11/30/2014  . Acute UTI 11/17/2014  . Psychosis (HCC) 11/17/2014  . Risk for falls 11/09/2014  . OA (osteoarthritis) of knee 01/13/2014  . Bruising, spontaneous 12/29/2013  . Allergic rhinitis due to pollen 09/10/2013  . Benign essential  hypertension 09/10/2013  . BMI 28.0-28.9,adult 09/10/2013  . Common migraine with intractable migraine, so stated 09/10/2013  . Other B-complex deficiencies 09/10/2013  . Other disorder of calcium metabolism 09/10/2013  . Major depressive disorder, recurrent episode, moderate (HCC) 06/10/2013  . Acute bronchitis 06/09/2013  . Onychomycosis 09/17/2012  . Age-related osteoporosis without current pathological fracture 10/30/2011  . Depression 10/30/2011    Past Surgical History:  Procedure Laterality Date  . ABDOMINAL HYSTERECTOMY    . CHOLECYSTECTOMY    . FOOT SURGERY Left   . LEG SURGERY Left   . TONSILLECTOMY    . WRIST SURGERY Left     OB History   No obstetric history on file.      Home Medications    Prior to Admission medications   Medication Sig Start Date End Date Taking? Authorizing Provider  acetaminophen (TYLENOL) 500 MG tablet Take 500 mg by mouth every 6 (six) hours as needed.   Yes [provider]  aspirin EC 81 MG tablet Take by mouth.   Yes [provider]  Calcium Carbonate-Vitamin D (CALCIUM 500/D PO) Take 500 mg by mouth.   Yes [provider]  Cholecalciferol 25 MCG (1000 UT) tablet Take by mouth.   Yes [provider]  clorazepate (TRANXENE) 15 MG tablet 7.5 mg.  02/24/11  Yes [provider]  clotrimazole (LOTRIMIN) 1 % cream  11/04/12  Yes [provider]  Cranberry 450 MG TABS Take by mouth.   Yes [provider]  diclofenac sodium (VOLTAREN) 1 % GEL  APPLY TOPICALLY ON LEG FOUR TIMES A DAY AS NEEDED 11/20/17  Yes [provider]  Diethylpropion HCl CR 75 MG TB24 Take 1 tablet by mouth 3 (three) times daily. 09/17/17  Yes [provider]  Ergocalciferol (VITAMIN D2) 2000 units TABS Take by mouth.   Yes [provider]  escitalopram (LEXAPRO) 20 MG tablet Take by mouth.   Yes [provider]  estradiol (ESTRACE) 0.1 MG/GM vaginal cream Place vaginally. 04/17/16   Yes [provider]  fexofenadine (ALLEGRA) 180 MG tablet Take by mouth. 08/02/10  Yes [provider]  fluticasone (FLONASE) 50 MCG/ACT nasal spray Place into the nose. 08/02/10  Yes [provider]  hydrocortisone 2.5 % cream Place rectally. 08/13/19  Yes [provider]  lactulose (CHRONULAC) 10 GM/15ML solution prn as needed 08/02/10  Yes [provider]  lamoTRIgine (LAMICTAL) 100 MG tablet  12/02/16  Yes [provider]  omeprazole (PRILOSEC) 20 MG capsule Take by mouth. 06/28/17 06/04/20 Yes [provider]  ondansetron (ZOFRAN) 4 MG tablet Take by mouth. 04/30/15  Yes [provider]  PROCTOSOL HC 2.5 % rectal cream APPLY TO AFFECTED AREA 3 TIMES A DAY 12/12/17  Yes [provider]  promethazine (PHENERGAN) 25 MG tablet Take by mouth. 12/13/11  Yes [provider]  raloxifene (EVISTA) 60 MG tablet raloxifene 60 mg tablet 01/15/20  Yes [provider]  risperiDONE (RISPERDAL) 0.25 MG tablet Take by mouth.   Yes [provider]  simvastatin (ZOCOR) 20 MG tablet Take by mouth. 11/01/17  Yes [provider]  solifenacin (VESICARE) 5 MG tablet Take 5 mg by mouth daily. 01/30/20  Yes [provider]  ziprasidone (GEODON) 40 MG capsule 80 mg. Pt takes 2 tablets daily 06/19/16  Yes [provider]  zonisamide (ZONEGRAN) 100 MG capsule 2 (two) times daily. 07/20/11  Yes [provider]  amoxicillin (AMOXIL) 875 MG tablet Take 1 tablet (875 mg total) by mouth 2 (two) times daily. 06/04/20   Payton Mccallum, MD  amphetamine-dextroamphetamine (ADDERALL) 30 MG tablet Take 30 mg by mouth.    [provider]  cyanocobalamin (,VITAMIN B-12,) 1000 MCG/ML injection Inject into the muscle. 04/19/15   [provider]  cyclobenzaprine (FLEXERIL) 10 MG tablet Take 10 mg by mouth.    [provider]  magnesium oxide (MAG-OX) 400 MG tablet Take by mouth.     [provider]  naproxen (NAPROSYN) 500 MG tablet Take 500 mg by mouth. 12/01/15   [provider]  ramelteon (ROZEREM) 8 MG tablet Reported on 04/03/2016 11/26/15   [provider]  UNABLE TO FIND Med Name: estradiol .1mg /ml vb cream  INSERT 4 CLICKS (= ) VAGINALLY AT BEDTIME TWO TIMES PER WEEK    [provider]  zolpidem (AMBIEN) 10 MG tablet Take 20 mg by mouth. 10/04/15   [provider]    Family History Family History  Problem Relation Age of Onset  . Hypertension Mother   . Stroke Father   . Heart attack Father     Social History Social History   Tobacco Use  . Smoking status: Former Smoker    Years: 1.00    Types: Cigarettes    Quit date: 06/05/1967    Years since quitting: 53.0  . Smokeless tobacco: Never Used  Vaping Use  . Vaping Use: Never used  Substance Use Topics  . Alcohol use: Yes    Comment: ocassionally  . Drug use: Never  Allergies   Atorvastatin, Risperidone, Diclofenac, Prednisone, Codeine, and Rizatriptan   Review of Systems Review of Systems  Neurological: Positive for headaches.     Physical Exam Triage Vital Signs ED Triage Vitals  Enc Vitals Group     BP 06/04/20 1444 131/75     Pulse Rate 06/04/20 1444 78     Resp 06/04/20 1444 18     Temp 06/04/20 1444 98.6 F (37 C)     Temp Source 06/04/20 1444 Oral     SpO2 06/04/20 1444 97 %     Weight 06/04/20 1444 174 lb (78.9 kg)     Height 06/04/20 1444 5\' 6"  (1.676 m)     Head Circumference --      Peak Flow --      Pain Score 06/04/20 1443 10     Pain Loc --      Pain Edu? --      Excl. in [place]? --    No data found.  Updated Vital Signs BP 131/75 (BP Location: Left Arm)   Pulse 78   Temp 98.6 F (37 C) (Oral)   Resp 18   Ht 5\' 6"  (1.676 m)   Wt 78.9 kg   SpO2 97%   BMI 28.08 kg/m   Visual Acuity Right Eye Distance:   Left Eye Distance:   Bilateral Distance:    Right Eye Near:   Left Eye Near:    Bilateral Near:       Physical Exam Vitals and nursing note reviewed.  Constitutional:      General: She is not in acute distress.    Appearance: She is not toxic-appearing or diaphoretic.  HENT:     Right Ear: A middle ear effusion is present. Tympanic membrane is erythematous.     Left Ear: A middle ear effusion is present. Tympanic membrane is erythematous.     Nose:     Right Sinus: Frontal sinus tenderness present.     Left Sinus: Frontal sinus tenderness present.     Mouth/Throat:     Pharynx: Posterior oropharyngeal erythema present. No oropharyngeal exudate.  Cardiovascular:     Rate and Rhythm: Normal rate.     Heart sounds: Normal heart sounds.  Pulmonary:     Effort: Pulmonary effort is normal. No respiratory distress.     Breath sounds: Normal breath sounds.  Musculoskeletal:     Cervical back: Neck supple.  Neurological:     Mental Status: She is alert.      UC Treatments / Results  Labs (all labs ordered are listed, but only abnormal results are displayed) Labs Reviewed - No data to display  EKG   Radiology No results found.  Procedures Procedures (including critical care time)  Medications Ordered in UC Medications - No data to display  Initial Impression / Assessment and Plan / UC Course  I have reviewed the triage vital signs and the nursing notes.  Pertinent labs & imaging results that were available during my care of the patient were reviewed by me and considered in my medical decision making (see chart for details).      Final Clinical Impressions(s) / UC Diagnoses   Final diagnoses:  Acute frontal sinusitis, recurrence not specified  Bilateral acute serous otitis media, recurrence not specified    ED Prescriptions    Medication Sig Dispense Auth. Provider   amoxicillin (AMOXIL) 875 MG tablet Take 1 tablet (875 mg total) by mouth 2 (two) times daily. 20 tablet [place],  MD      1. diagnosis reviewed with patient 2. rx as per orders above;  reviewed possible side effects, interactions, risks and benefits  3. Recommend supportive treatment with otc analgesics, nasal steroid spray 4. Follow-up prn if symptoms worsen or don't improve   PDMP not reviewed this encounter.   Payton Mccallum, MD 06/04/20 1520

## 2021-03-07 ENCOUNTER — Other Ambulatory Visit: Payer: Self-pay | Admitting: Physical Medicine & Rehabilitation

## 2021-03-07 DIAGNOSIS — M5412 Radiculopathy, cervical region: Secondary | ICD-10-CM

## 2021-03-18 ENCOUNTER — Other Ambulatory Visit (INDEPENDENT_AMBULATORY_CARE_PROVIDER_SITE_OTHER): Payer: Self-pay | Admitting: Nurse Practitioner

## 2021-03-18 DIAGNOSIS — I6523 Occlusion and stenosis of bilateral carotid arteries: Secondary | ICD-10-CM

## 2021-03-18 NOTE — Progress Notes (Signed)
MRN : [id number]  Andrea Sims is a 75 y.o. ([birth year]) female who presents with chief complaint of No chief complaint on file. Marland Kitchen  History of Present Illness:   The patient is seen for follow up evaluation of carotid stenosis. The carotid stenosis followed by ultrasound.   The patient denies amaurosis fugax. There is no recent history of TIA symptoms or focal motor deficits. There is no prior documented CVA.  The patient is taking enteric-coated aspirin 81 mg daily.  There is no history of migraine headaches. There is no history of seizures.  The patient has a history of coronary artery disease, no recent episodes of angina or shortness of breath. The patient denies PAD or claudication symptoms. There is a history of hyperlipidemia which is being treated with a statin.  Previous duplex ultrasound of the carotid arteries shows <40% bilateral ICA stenosis, tortuous ICA noted. No significant change compared to study of 03/15/2020.  No outpatient medications have been marked as taking for the 03/21/21 encounter (Appointment) with Gilda Crease, Latina Craver, MD.    Past Medical History:  Diagnosis Date  . Carotid artery occlusion   . History of depression   . Hyperlipidemia   . Hypertension   . Osteoporosis     Past Surgical History:  Procedure Laterality Date  . ABDOMINAL HYSTERECTOMY    . CHOLECYSTECTOMY    . FOOT SURGERY Left   . LEG SURGERY Left   . TONSILLECTOMY    . WRIST SURGERY Left     Social History Social History   Tobacco Use  . Smoking status: Former Smoker    Years: 1.00    Types: Cigarettes    Quit date: 06/05/1967    Years since quitting: 53.8  . Smokeless tobacco: Never Used  Vaping Use  . Vaping Use: Never used  Substance Use Topics  . Alcohol use: Yes    Comment: ocassionally  . Drug use: Never    Family History Family History  Problem Relation Age of Onset  . Hypertension Mother   . Stroke Father   . Heart attack Father     Allergies   Allergen Reactions  . Atorvastatin     Other reaction(s): Muscle Pain  . Risperidone Other (See Comments)    Other reaction(s): Other (See Comments) Hallucinations Other Reaction: Hallucinations Hallucinations Other Reaction: Hallucinations Hallucinations   . Diclofenac Other (See Comments)    Other reaction(s): Other (See Comments) Other Reaction: patch = nausea Other Reaction: patch = nausea   . Prednisone Other (See Comments)    Other reaction(s): Other (See Comments) Mood Change Mood Change Anxiety   . Codeine Itching  . Rizatriptan Palpitations     REVIEW OF SYSTEMS (Negative unless checked)  Constitutional: [] Weight loss  [] Fever  [] Chills Cardiac: [] Chest pain   [] Chest pressure   [] Palpitations   [] Shortness of breath when laying flat   [] Shortness of breath with exertion. Vascular:  [] Pain in legs with walking   [] Pain in legs at rest  [] History of DVT   [] Phlebitis   [] Swelling in legs   [] Varicose veins   [] Non-healing ulcers Pulmonary:   [] Uses home oxygen   [] Productive cough   [] Hemoptysis   [] Wheeze  [] COPD   [] Asthma Neurologic:  [] Dizziness   [] Seizures   [] History of stroke   [] History of TIA  [] Aphasia   [] Vissual changes   [] Weakness or numbness in arm   [] Weakness or numbness in leg Musculoskeletal:   [] Joint swelling   [] Joint  pain   [] Low back pain Hematologic:  [] Easy bruising  [] Easy bleeding   [] Hypercoagulable state   [] Anemic Gastrointestinal:  [] Diarrhea   [] Vomiting  [x] Gastroesophageal reflux/heartburn   [] Difficulty swallowing. Genitourinary:  [] Chronic kidney disease   [] Difficult urination  [] Frequent urination   [] Blood in urine Skin:  [] Rashes   [] Ulcers  Psychological:  [] History of anxiety   []  History of major depression.  Physical Examination  There were no vitals filed for this visit. There is no height or weight on file to calculate BMI. Gen: WD/WN, NAD Head: [place]/AT, No temporalis wasting.  Ear/Nose/Throat: Hearing grossly  intact, nares w/o erythema or drainage Eyes: PER, EOMI, sclera nonicteric.  Neck: Supple, no large masses.   Pulmonary:  Good air movement, no audible wheezing bilaterally, no use of accessory muscles.  Cardiac: RRR, no JVD Vascular: carotid bruits Vessel Right Left  Radial Palpable Palpable  Carotid Palpable Palpable  Gastrointestinal: Non-distended. No guarding/no peritoneal signs.  Musculoskeletal: M/S 5/5 throughout.  No deformity or atrophy.  Neurologic: CN 2-12 intact. Symmetrical.  Speech is fluent. Motor exam as listed above. Psychiatric: Judgment intact, Mood & affect appropriate for pt's clinical situation. Dermatologic: No rashes or ulcers noted.  No changes consistent with cellulitis. Lymph : No lichenification or skin changes of chronic lymphedema.  CBC No results found for: WBC, HGB, HCT, MCV, PLT  BMET No results found for: NA, K, CL, CO2, GLUCOSE, BUN, CREATININE, CALCIUM, GFRNONAA, GFRAA CrCl cannot be calculated (No successful lab value found.).  COAG No results found for: INR, PROTIME  Radiology No results found.   Assessment/Plan 1. Bilateral carotid artery stenosis Recommend:  Given the patient's asymptomaticcarotid stenosis no  invasive testing or surgery at this time.  Previous duplex ultrasound shows <40% stenosis bilaterally.  Patient is overdue for her follow-up ultrasound and this will be ordered.  She will follow-up with me to review the results.  Continue antiplatelet therapy as prescribed Continue management of CAD, HTN and Hyperlipidemia Healthy heart diet,  encouraged exercise at least 4 times per week Follow up in 1 months with duplex ultrasound and physical exam   - VAS CAROTID; Future  2. Benign essential hypertension Continue antihypertensive medications as already ordered, these medications have been reviewed and there are no changes at this time.   3. Gastroesophageal reflux disease without esophagitis Continue PPI as  already ordered, this medication has been reviewed and there are no changes at this time.  Avoidence of caffeine and alcohol  Moderate elevation of the head of the bed   4. Hyperlipidemia, unspecified hyperlipidemia type Continue statin as ordered and reviewed, no changes at this time    , MD  03/18/2021 3:42 PM

## 2021-03-21 ENCOUNTER — Other Ambulatory Visit: Payer: Self-pay

## 2021-03-21 ENCOUNTER — Encounter (INDEPENDENT_AMBULATORY_CARE_PROVIDER_SITE_OTHER): Payer: Self-pay | Admitting: Vascular Surgery

## 2021-03-21 ENCOUNTER — Ambulatory Visit (INDEPENDENT_AMBULATORY_CARE_PROVIDER_SITE_OTHER): Payer: Medicare Other

## 2021-03-21 ENCOUNTER — Ambulatory Visit (INDEPENDENT_AMBULATORY_CARE_PROVIDER_SITE_OTHER): Payer: Medicare Other | Admitting: Vascular Surgery

## 2021-03-21 VITALS — BP 153/74 | HR 92 | Ht 66.0 in | Wt 179.0 lb

## 2021-03-21 DIAGNOSIS — I6523 Occlusion and stenosis of bilateral carotid arteries: Secondary | ICD-10-CM | POA: Diagnosis not present

## 2021-03-21 DIAGNOSIS — K219 Gastro-esophageal reflux disease without esophagitis: Secondary | ICD-10-CM | POA: Diagnosis not present

## 2021-03-21 DIAGNOSIS — I10 Essential (primary) hypertension: Secondary | ICD-10-CM

## 2021-03-21 DIAGNOSIS — E785 Hyperlipidemia, unspecified: Secondary | ICD-10-CM | POA: Diagnosis not present

## 2021-03-22 ENCOUNTER — Ambulatory Visit
Admission: RE | Admit: 2021-03-22 | Discharge: 2021-03-22 | Disposition: A | Discharge status: Home / Self Care | Payer: Medicare Other | Source: Ambulatory Visit | Attending: Physical Medicine & Rehabilitation | Admitting: Physical Medicine & Rehabilitation

## 2021-03-22 DIAGNOSIS — M5412 Radiculopathy, cervical region: Secondary | ICD-10-CM | POA: Insufficient documentation

## 2021-06-25 ENCOUNTER — Other Ambulatory Visit: Payer: Self-pay

## 2021-06-25 ENCOUNTER — Encounter: Payer: Self-pay | Admitting: Emergency Medicine

## 2021-06-25 ENCOUNTER — Ambulatory Visit
Admission: EM | Admit: 2021-06-25 | Discharge: 2021-06-25 | Disposition: A | Discharge status: Home / Self Care | Payer: Medicare Other | Attending: Emergency Medicine | Admitting: Emergency Medicine

## 2021-06-25 DIAGNOSIS — Z20822 Contact with and (suspected) exposure to covid-19: Secondary | ICD-10-CM | POA: Insufficient documentation

## 2021-06-25 DIAGNOSIS — J069 Acute upper respiratory infection, unspecified: Secondary | ICD-10-CM | POA: Diagnosis not present

## 2021-06-25 DIAGNOSIS — Z791 Long term (current) use of non-steroidal anti-inflammatories (NSAID): Secondary | ICD-10-CM | POA: Insufficient documentation

## 2021-06-25 DIAGNOSIS — R051 Acute cough: Secondary | ICD-10-CM | POA: Diagnosis present

## 2021-06-25 DIAGNOSIS — Z87891 Personal history of nicotine dependence: Secondary | ICD-10-CM | POA: Insufficient documentation

## 2021-06-25 DIAGNOSIS — Z79899 Other long term (current) drug therapy: Secondary | ICD-10-CM | POA: Insufficient documentation

## 2021-06-25 LAB — INFLUENZA A AND B ANTIGEN (CONVERTED LAB)
INFLUENZA A ANTIGEN, POC: NEGATIVE
INFLUENZA B ANTIGEN, POC: NEGATIVE

## 2021-06-25 LAB — POC SARS CORONAVIRUS 2 AG: SARSCOV2ONAVIRUS 2 AG: NEGATIVE

## 2021-06-25 MED ORDER — BENZONATATE 100 MG PO CAPS: 200.0000 mg | ORAL_CAPSULE | Freq: Three times a day (TID) | ORAL | 0 refills | Status: AC

## 2021-06-25 MED ORDER — PROMETHAZINE-DM 6.25-15 MG/5ML PO SYRP: 5.0000 mL | ORAL_SOLUTION | Freq: Four times a day (QID) | ORAL | 0 refills | Status: AC | PRN

## 2021-06-25 NOTE — ED Triage Notes (Addendum)
On Thursday started feeling bad.  Patient was at the beach and has just arrived back in town.  Patient reports worsening cough, aching, coughing up phlegm.  Complains of headache, bilateral ear pain, and sore throat

## 2021-06-25 NOTE — ED Provider Notes (Signed)
[facility]    CSN: [id number] Arrival date & time: 06/25/21  1110      History   Chief Complaint Chief Complaint  Patient presents with   Cough    HPI Andrea Sims is a 75 y.o. female.   HPI  75 year old female here for evaluation of respiratory complaint.  Patient reports that she has been dealing with a nonproductive cough for the last month but the cough became productive for a thick phlegm 2 days ago.  She does not produce phlegm all the time but a couple of times a day.  She states that it hurts when she coughs but is not associated with shortness of breath or wheezing.  She has also been experiencing body aches, headache, sore throat, fatigue, and bilateral ear pain.  She has had nausea.  She denies runny nose or nasal congestion, shortness breath or wheezing, vomiting or diarrhea.  Her daughter-in-law had similar symptoms.  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   Closed fracture of left distal radius 09/16/2019   GERD (gastroesophageal reflux disease) 12/27/2017   Bilateral carotid artery stenosis 11/22/2017   Hyperlipidemia 11/22/2017   Sun-damaged skin 07/03/2016   Alkaline phosphatase elevation 12/02/2015   High risk medication use 10/18/2015   Premature atrial contractions 09/13/2015   Closed displaced transverse fracture of left patella 08/17/2015   Fracture of fibula, distal, right, closed 07/14/2015   Dysfunctional voiding of urine 05/20/2015   Incomplete emptying of bladder 05/20/2015   Urinary, incontinence, stress female 05/20/2015   Closed fracture of shaft of left femur (HCC) 05/10/2015   Keratosis, seborrheic  12/15/2014   Wrist pain, acute, left 12/04/2014   Stress fracture of femur 11/30/2014   Acute UTI 11/17/2014   Psychosis (HCC) 11/17/2014   Risk for falls 11/09/2014   OA (osteoarthritis) of knee 01/13/2014   Bruising, spontaneous 12/29/2013   Allergic rhinitis due to pollen 09/10/2013   Benign essential hypertension 09/10/2013   BMI 28.0-28.9,adult 09/10/2013   Common migraine with intractable migraine, so stated 09/10/2013   Other B-complex deficiencies 09/10/2013   Other disorder of calcium metabolism 09/10/2013   Major depressive disorder, recurrent episode, moderate (HCC) 06/10/2013   Acute bronchitis 06/09/2013   Onychomycosis 09/17/2012   Age-related osteoporosis without current pathological fracture 10/30/2011   Depression 10/30/2011    Past Surgical History:  Procedure Laterality Date   ABDOMINAL HYSTERECTOMY     CHOLECYSTECTOMY     FOOT SURGERY Left    LEG SURGERY Left    TONSILLECTOMY     WRIST SURGERY Left     OB History   No obstetric history on file.      Home Medications    Prior to Admission medications   Medication Sig Start Date End Date Taking? Authorizing Provider  amphetamine-dextroamphetamine (ADDERALL) 30 MG tablet Take 30 mg by mouth.   Yes [provider]  benzonatate (TESSALON) 100 MG capsule Take 2 capsules (200 mg total) by mouth every 8 (eight) hours. 06/25/21  Yes Becky Augusta, NP  clorazepate (TRANXENE) 15 MG tablet 7.5 mg.  02/24/11  Yes [provider]  escitalopram (LEXAPRO) 20 MG tablet Take by mouth.   Yes [provider]  lamoTRIgine (LAMICTAL) 100 MG tablet  12/02/16  Yes [provider]  magnesium oxide (MAG-OX) 400 MG tablet Take by mouth.   Yes [provider]  promethazine-dextromethorphan (PROMETHAZINE-DM) 6.25-15 MG/5ML syrup Take 5 mLs by mouth 4 (four) times daily as needed. 06/25/21  Yes Becky Augusta, NP  ramelteon (ROZEREM) 8 MG tablet Reported on 04/03/2016 11/26/15  Yes [provider]  risperiDONE (RISPERDAL) 0.25 MG tablet Take by mouth.   Yes [provider]  solifenacin (VESICARE) 5 MG tablet Take 5 mg by mouth daily. 01/30/20  Yes [provider]  ziprasidone (GEODON) 40 MG capsule 80 mg. Pt takes 2 tablets daily 06/19/16  Yes [provider]  zonisamide (ZONEGRAN) 100 MG capsule 2 (two) times daily. 07/20/11  Yes [provider]  acetaminophen (TYLENOL) 500 MG tablet Take 500 mg by mouth every 6 (six) hours as needed.    [provider]  amoxicillin (AMOXIL) 875 MG tablet Take 1 tablet (875 mg total) by mouth 2 (two) times daily. 06/04/20   Payton Mccallum, MD  aspirin EC 81 MG tablet Take by mouth.    [provider]  Calcium Carbonate-Vitamin D (CALCIUM 500/D PO) Take 500 mg by mouth.    [provider]  Cholecalciferol 25 MCG (1000 UT) tablet Take by mouth.    [provider]  clotrimazole (LOTRIMIN) 1 % cream  11/04/12   [provider]  Cranberry 450 MG TABS Take by mouth.    [provider]  cyanocobalamin (,VITAMIN B-12,) 1000 MCG/ML injection Inject into the muscle. 04/19/15   [provider]  cyclobenzaprine (FLEXERIL) 10 MG tablet Take 10 mg by mouth. Patient not taking: Reported on 06/25/2021    [provider]  cyclobenzaprine (FLEXERIL) 5 MG tablet Take 5 mg by mouth at bedtime as needed. Patient not taking: Reported on 06/25/2021 03/16/21   [provider]  diclofenac sodium (VOLTAREN) 1 % GEL APPLY TOPICALLY ON LEG FOUR TIMES A DAY AS NEEDED 11/20/17   [provider]  Diethylpropion HCl CR 75 MG TB24 Take 1 tablet by mouth 3 (three) times daily. 09/17/17   [provider]  Ergocalciferol (VITAMIN D2) 2000 units TABS Take by mouth.    [provider]  estradiol (ESTRACE) 0.1 MG/GM vaginal cream Place vaginally. 04/17/16   [provider]  fexofenadine (ALLEGRA) 180 MG tablet Take by mouth. 08/02/10    [provider]  fluticasone (FLONASE) 50 MCG/ACT nasal spray Place into the nose. 08/02/10   [provider]  hydrocortisone (ANUSOL-HC) 2.5 % rectal cream SMARTSIG:Rectally 3 Times Daily PRN 12/31/20   [provider]  hydrocortisone 2.5 % cream Place rectally. 08/13/19   [provider]  lactulose (CHRONULAC) 10 GM/15ML solution prn as needed 08/02/10   [provider]  naproxen (NAPROSYN) 500 MG tablet Take 500 mg by mouth. 12/01/15   [provider]  omeprazole (PRILOSEC) 20 MG capsule Take by mouth. 06/28/17 06/04/20  [provider]  ondansetron (ZOFRAN) 4 MG tablet Take by mouth. 04/30/15   [provider]  PROCTOSOL HC 2.5 % rectal cream APPLY TO AFFECTED AREA 3 TIMES A DAY 12/12/17   [provider]  promethazine (PHENERGAN) 25 MG tablet Take by mouth. 12/13/11   [provider]  raloxifene (EVISTA) 60 MG tablet raloxifene 60 mg tablet 01/15/20   [provider]  simvastatin (ZOCOR) 20  MG tablet Take by mouth. 11/01/17   [provider]  traMADol (ULTRAM) 50 MG tablet tramadol 50 mg tablet    [provider]  UNABLE TO FIND Med Name: estradiol .1mg /ml vb cream  INSERT 4 CLICKS (= ) VAGINALLY AT BEDTIME TWO TIMES PER WEEK    [provider]  zolpidem (AMBIEN) 10 MG tablet Take 20 mg by mouth. Patient not taking: Reported on 06/25/2021 10/04/15   [provider]    Family History Family History  Problem Relation Age of Onset   Hypertension Mother    Stroke Father    Heart attack Father     Social History Social History   Tobacco Use   Smoking status: Former    Years: 1.00    Pack years: 0.00    Types: Cigarettes    Quit date: 06/05/1967    Years since quitting: 54.0   Smokeless tobacco: Never  Vaping Use   Vaping Use: Never used  Substance Use Topics   Alcohol use: Yes    Comment: ocassionally   Drug use: Never     Allergies   Atorvastatin,  Risperidone, Diclofenac, Prednisone, Codeine, and Rizatriptan   Review of Systems Review of Systems  Constitutional:  Positive for fatigue. Negative for activity change, appetite change and fever.  HENT:  Positive for ear pain and sore throat. Negative for congestion and rhinorrhea.   Respiratory:  Positive for cough. Negative for shortness of breath and wheezing.   Gastrointestinal:  Positive for nausea. Negative for diarrhea and vomiting.  Musculoskeletal:  Positive for arthralgias and myalgias.  Skin:  Negative for rash.  Neurological:  Positive for headaches.  Hematological: Negative.   Psychiatric/Behavioral: Negative.      Physical Exam Triage Vital Signs ED Triage Vitals  Enc Vitals Group     BP 06/25/21 1150 (!) 128/58     Pulse Rate 06/25/21 1150 88     Resp 06/25/21 1150 18     Temp 06/25/21 1150 98.2 F (36.8 C)     Temp Source 06/25/21 1150 Oral     SpO2 06/25/21 1150 99 %     Weight --      Height --      Head Circumference --      Peak Flow --      Pain Score 06/25/21 1143 10     Pain Loc --      Pain Edu? --      Excl. in GC? --    No data found.  Updated Vital Signs BP (!) 128/58 (BP Location: Right Arm)   Pulse 88   Temp 98.2 F (36.8 C) (Oral)   Resp 18   SpO2 99%   Visual Acuity Right Eye Distance:   Left Eye Distance:   Bilateral Distance:    Right Eye Near:   Left Eye Near:    Bilateral Near:     Physical Exam Vitals and nursing note reviewed.  Constitutional:      Appearance: Normal appearance. She is ill-appearing.  HENT:     Head: Normocephalic and atraumatic.     Right Ear: Tympanic membrane, ear canal and external ear normal. There is no impacted cerumen.     Left Ear: Tympanic membrane, ear canal and external ear normal. There is no impacted cerumen.     Nose: Congestion and rhinorrhea present.     Mouth/Throat:     Mouth: Mucous membranes are moist.     Pharynx: Oropharynx is clear. Posterior oropharyngeal  erythema present.   Cardiovascular:     Rate and Rhythm: Normal rate and regular rhythm.     Pulses: Normal pulses.     Heart sounds: Normal heart sounds. No murmur heard.   No gallop.  Pulmonary:     Effort: Pulmonary effort is normal.     Breath sounds: Normal breath sounds. No wheezing, rhonchi or rales.  Musculoskeletal:     Cervical back: Normal range of motion and neck supple.  Lymphadenopathy:     Cervical: No cervical adenopathy.  Skin:    General: Skin is warm and dry.     Capillary Refill: Capillary refill takes less than 2 seconds.     Findings: No erythema or rash.  Neurological:     General: No focal deficit present.     Mental Status: She is alert and oriented to person, place, and time.  Psychiatric:        Mood and Affect: Mood normal.        Behavior: Behavior normal.        Thought Content: Thought content normal.        Judgment: Judgment normal.     UC Treatments / Results  Labs (all labs ordered are listed, but only abnormal results are displayed) Labs Reviewed  INFLUENZA A AND B ANTIGEN (CONVERTED LAB)  POC INFLUENZA A AND B ANTIGEN (URGENT CARE ONLY)  POC SARS CORONAVIRUS 2 AG -  ED  POC SARS CORONAVIRUS 2 AG    EKG   Radiology No results found.  Procedures Procedures (including critical care time)  Medications Ordered in UC Medications - No data to display  Initial Impression / Assessment and Plan / UC Course  I have reviewed the triage vital signs and the nursing notes.  Pertinent labs & imaging results that were available during my care of the patient were reviewed by me and considered in my medical decision making (see chart for details).  Patient is a very pleasant though ill-appearing 75 year old female here for evaluation of COVID/flulike symptoms as outlined in the HPI.  Patient's physical exam reveals pearly gray tympanic membranes bilaterally with normal light reflex and clear external auditory canals.  Nasal mucosa is mildly erythematous and  edematous with scant clear nasal discharge.  Patient does have mild tenderness to percussion of bilateral maxillary sinuses.  Oropharyngeal exam reveals minor posterior oropharyngeal erythema with clear postnasal drip.  No cervical lymphadenopathy appreciated exam.  Cardiopulmonary exam is benign.  Will swab patient for COVID and influenza.  Rapid COVID and influenza are both negative.  Patient's exam is consistent with a viral respiratory illness.  Will treat with Tessalon Perles and Promethazine DM cough syrup.  Patient is not having any nasal congestion or runny nose so will not prescribe Atrovent nasal spray at this time.  Patient vies to follow-up with her primary care for any worsening symptoms.   Final Clinical Impressions(s) / UC Diagnoses   Final diagnoses:  Viral URI with cough     Discharge Instructions      Use the Tessalon Perles every 8 hours during the day.  Take them with a small sip of water.  They may give you some numbness to the base of your tongue or a metallic taste in your mouth, this is normal.  Use the Promethazine DM cough syrup at bedtime for cough and congestion.  It will make you drowsy so do not take it during the day.  Return for reevaluation or see your primary care provider for  any new or worsening symptoms.      ED Prescriptions     Medication Sig Dispense Auth. Provider   benzonatate (TESSALON) 100 MG capsule Take 2 capsules (200 mg total) by mouth every 8 (eight) hours. 21 capsule Becky Augusta, NP   promethazine-dextromethorphan (PROMETHAZINE-DM) 6.25-15 MG/5ML syrup Take 5 mLs by mouth 4 (four) times daily as needed. 118 mL Becky Augusta, NP      PDMP not reviewed this encounter.   Becky Augusta, NP 06/25/21 1256

## 2021-06-25 NOTE — Discharge Instructions (Addendum)
Use the Tessalon Perles every 8 hours during the day.  Take them with a small sip of water.  They may give you some numbness to the base of your tongue or a metallic taste in your mouth, this is normal.  Use the Promethazine DM cough syrup at bedtime for cough and congestion.  It will make you drowsy so do not take it during the day.  Return for reevaluation or see your primary care provider for any new or worsening symptoms.  

## 2021-10-29 IMAGING — MR MR CERVICAL SPINE W/O CM
5 series · 38 of 48 positions shown · non-contrast
Comparison: 02/17/2019.

CLINICAL DATA: Neck pain and stiffness, limited range of motion.

EXAM:
MRI CERVICAL SPINE WITHOUT CONTRAST
TECHNIQUE: Multiplanar, multisequence MR imaging of the cervical spine was
performed. No intravenous contrast was administered.

[Series 5: T2 · sagittal · 3.0mm · 0.62mm/px · 8 of 15 slices shown (1 of 2)]
[im 1/15]
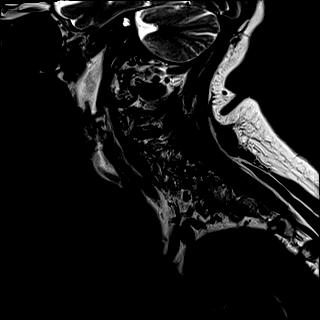
[im 3/15]
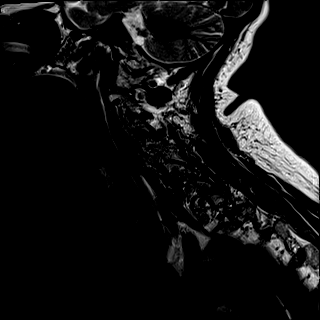
[im 5/15]
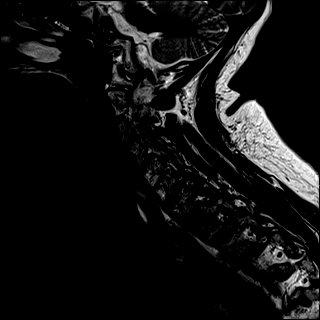
[im 7/15]
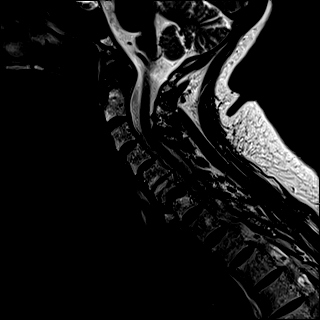
[im 9/15]
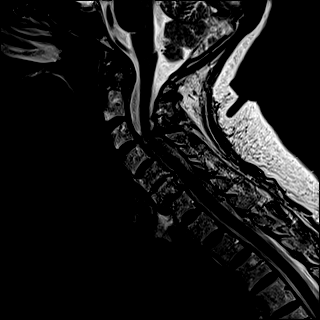
[im 11/15]
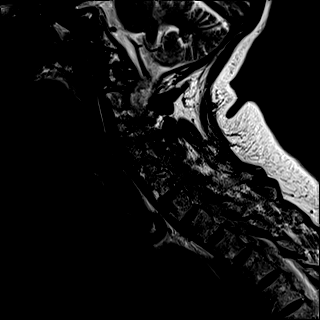
[im 13/15]
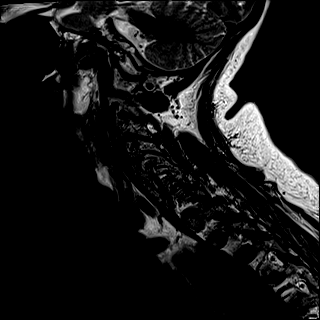
[im 15/15]
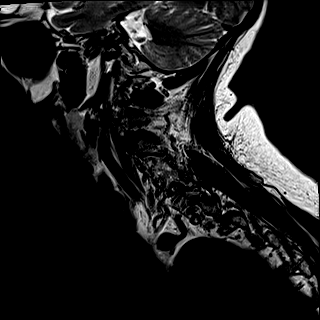

[Series 6: FLAIR · sagittal · 3.0mm · 0.78mm/px · 7 of 15 slices shown]
[im 1/15]
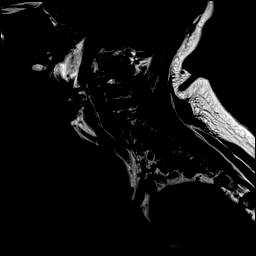
[im 3/15]
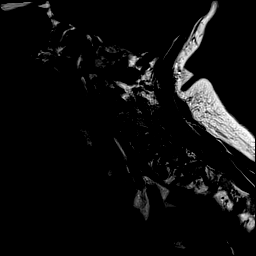
[im 5/15]
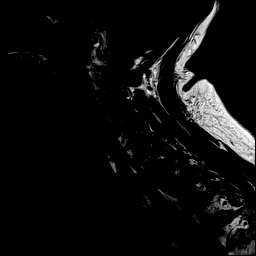
[im 8/15]
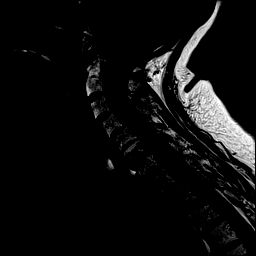
[im 10/15]
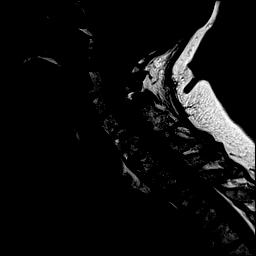
[im 12/15]
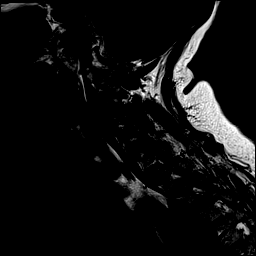
[im 15/15]
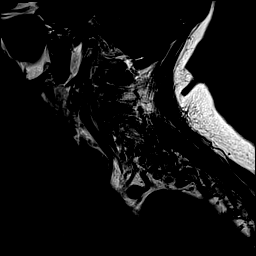

[Series 7: STIR · sagittal · 3.0mm · 0.62mm/px · 7 of 15 slices shown]
[im 1/15]
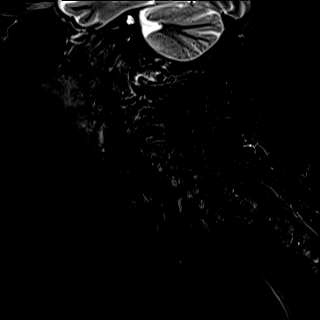
[im 3/15]
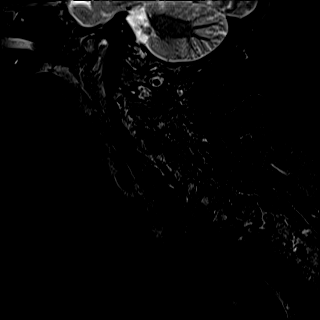
[im 5/15]
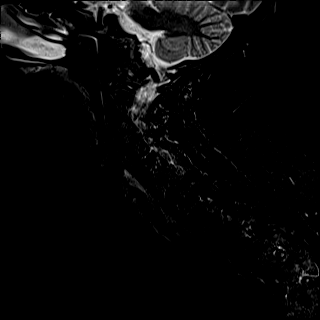
[im 8/15]
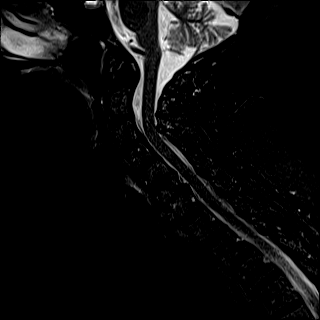
[im 10/15]
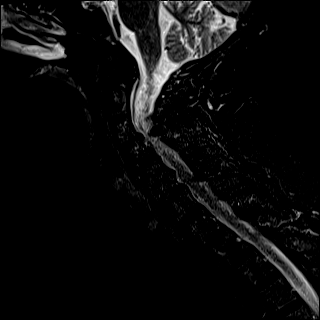
[im 12/15]
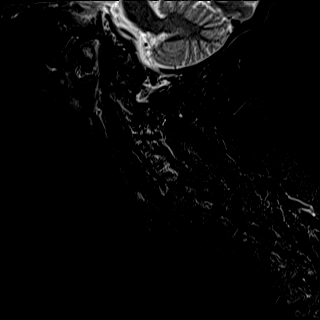
[im 15/15]
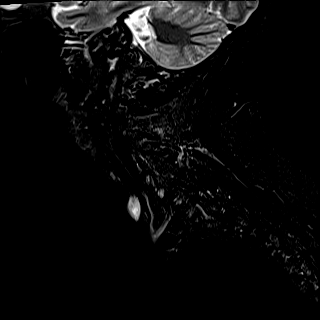

[Series 8: T2 · axial · 3.0mm · 0.70mm/px · z∈[-104,-29]mm · 9 of 27 slices shown (2 of 2)]
[im 1/27]
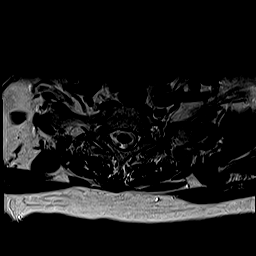
[im 5/27]
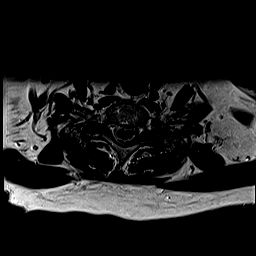
[im 9/27]
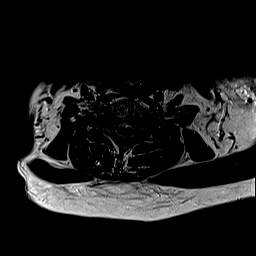
[im 11/27]
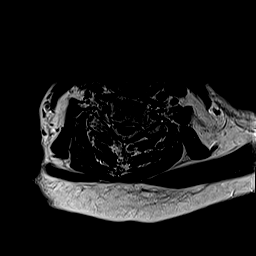
[im 14/27]
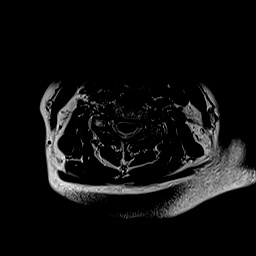
[im 16/27]
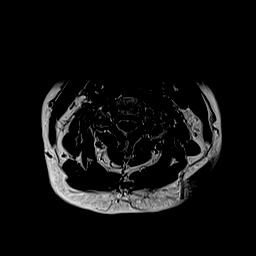
[im 18/27]
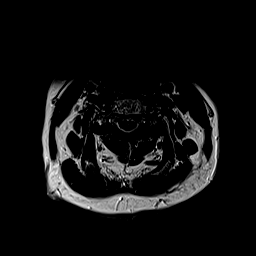
[im 22/27]
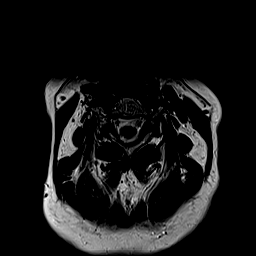
[im 27/27]
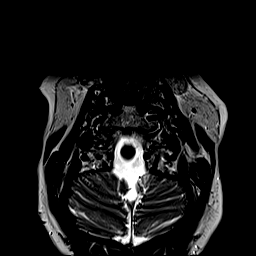

[Series 9: ax mpgr · axial · 3.0mm · 0.35mm/px · z∈[-104,-43]mm · 7 of 27 slices shown]
[im 1/27]
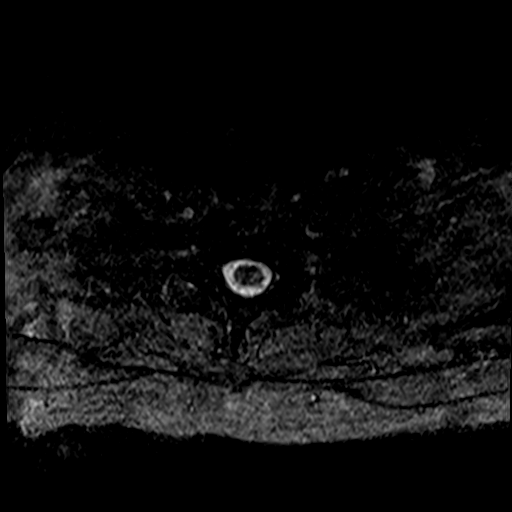
[im 5/27]
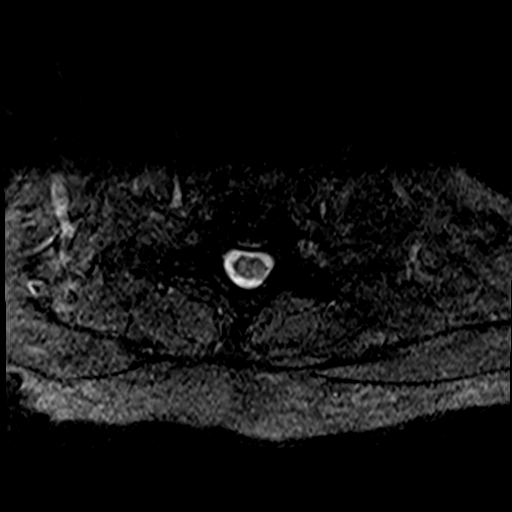
[im 9/27]
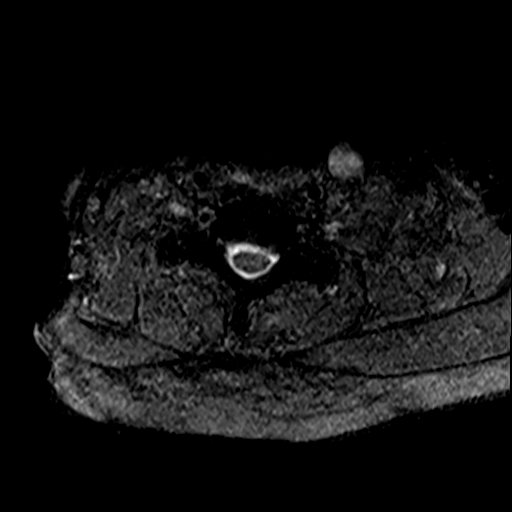
[im 11/27]
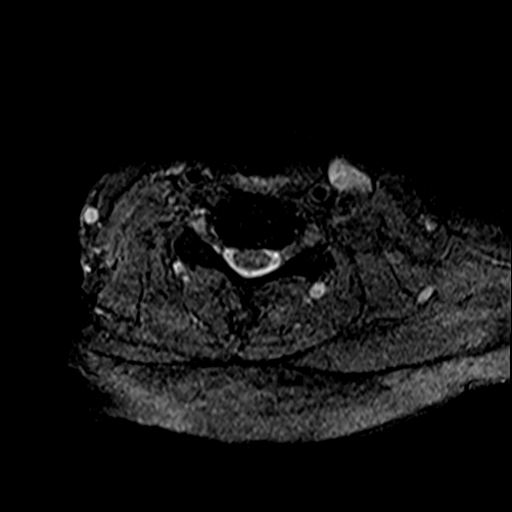
[im 16/27]
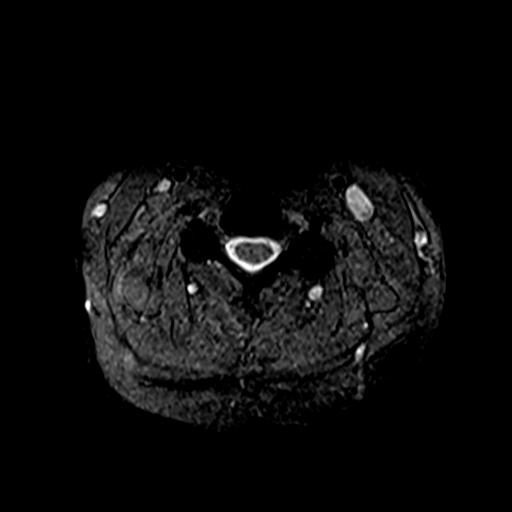
[im 18/27]
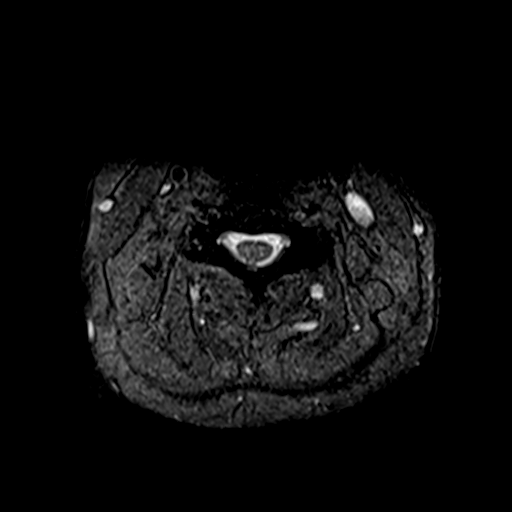
[im 22/27]
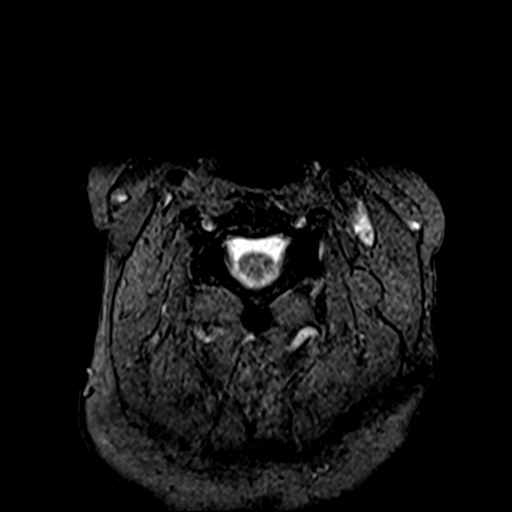

[38 of 48 positions shown; findings below may reference images not displayed]

FINDINGS: Alignment: Straightening of lordosis. C4-5, C6-7 grade 1
anterolisthesis.

Vertebrae: Modic type 2 endplate degenerative changes. No fracture
or aggressive osseous lesion. Partial fusion of the left C4-5 and
right C5-6 facet joint.

Cord: Normal signal and morphology.

Posterior Fossa, vertebral arteries: Negative.

Disc levels: Multilevel desiccation and disc space loss.

C2-3: Bilateral facet hypertrophy. Patent spinal canal and neural
foramen.

C3-4: Disc osteophyte complex with central and right subarticular
protrusions. Uncovertebral and facet hypertrophy. Prominent
ligamentum flavum. Mild spinal canal, mild right and moderate left
neural foraminal narrowing.

C4-5: Disc osteophyte complex with uncovertebral and facet
hypertrophy. Patent spinal canal and left neural foramen. Mild right
neural foraminal narrowing.

C5-6: Uncovertebral and facet hypertrophy. Patent spinal canal. Mild
bilateral neural foraminal narrowing.

C6-7: Disc osteophyte complex with uncovertebral and facet
hypertrophy. Prominent ligamentum flavum. Mild spinal canal,
moderate right and mild left neural foraminal narrowing.

C7-T1: No significant disc bulge. Uncovertebral and facet
degenerative spurring. Patent spinal canal and neural foramen.

Paraspinal tissues: Negative.
IMPRESSION: Mild spinal canal narrowing at the C3-4, C6-7 levels.

Moderate left C3-4 and right C6-7 neural foraminal narrowing.

Mild right C3-4, C4-5, bilateral C5-6 and left C6-7 neural foraminal
narrowing.

## 2022-03-20 ENCOUNTER — Other Ambulatory Visit: Payer: Self-pay

## 2022-03-20 ENCOUNTER — Ambulatory Visit (INDEPENDENT_AMBULATORY_CARE_PROVIDER_SITE_OTHER): Payer: Medicare Other | Admitting: Vascular Surgery

## 2022-03-20 ENCOUNTER — Ambulatory Visit (INDEPENDENT_AMBULATORY_CARE_PROVIDER_SITE_OTHER): Payer: Medicare Other

## 2022-03-20 DIAGNOSIS — I6523 Occlusion and stenosis of bilateral carotid arteries: Secondary | ICD-10-CM | POA: Diagnosis not present

## 2022-03-27 ENCOUNTER — Encounter (INDEPENDENT_AMBULATORY_CARE_PROVIDER_SITE_OTHER): Payer: Self-pay | Admitting: *Deleted

## 2022-10-23 ENCOUNTER — Encounter (INDEPENDENT_AMBULATORY_CARE_PROVIDER_SITE_OTHER): Payer: Self-pay

## 2023-03-12 ENCOUNTER — Other Ambulatory Visit (INDEPENDENT_AMBULATORY_CARE_PROVIDER_SITE_OTHER): Payer: Self-pay | Admitting: Vascular Surgery

## 2023-03-12 DIAGNOSIS — I6523 Occlusion and stenosis of bilateral carotid arteries: Secondary | ICD-10-CM

## 2023-03-19 ENCOUNTER — Encounter (INDEPENDENT_AMBULATORY_CARE_PROVIDER_SITE_OTHER): Payer: Self-pay | Admitting: Nurse Practitioner

## 2023-03-19 ENCOUNTER — Ambulatory Visit (INDEPENDENT_AMBULATORY_CARE_PROVIDER_SITE_OTHER): Payer: Medicare Other

## 2023-03-19 ENCOUNTER — Ambulatory Visit (INDEPENDENT_AMBULATORY_CARE_PROVIDER_SITE_OTHER): Payer: Medicare Other | Admitting: Nurse Practitioner

## 2023-03-19 VITALS — BP 109/61 | HR 56 | Resp 16 | Wt 176.0 lb

## 2023-03-19 DIAGNOSIS — K219 Gastro-esophageal reflux disease without esophagitis: Secondary | ICD-10-CM | POA: Diagnosis not present

## 2023-03-19 DIAGNOSIS — E785 Hyperlipidemia, unspecified: Secondary | ICD-10-CM

## 2023-03-19 DIAGNOSIS — I6523 Occlusion and stenosis of bilateral carotid arteries: Secondary | ICD-10-CM

## 2023-03-19 NOTE — Progress Notes (Signed)
MRN : [id number]  Andrea Sims is a 77 y.o. (01-11-1946) female who presents with chief complaint of  Chief Complaint  Patient presents with   Follow-up    Ultrasound follow up  .  History of Present Illness:   The patient is seen for follow up evaluation of carotid stenosis. The carotid stenosis followed by ultrasound.    The patient denies amaurosis fugax. There is no recent history of TIA symptoms or focal motor deficits. There is no prior documented CVA.   The patient is taking enteric-coated aspirin 81 mg daily.   There is no history of migraine headaches. There is no history of seizures.   The patient has a history of coronary artery disease, no recent episodes of angina or shortness of breath. The patient denies PAD or claudication symptoms. There is a history of hyperlipidemia which is being treated with a statin.     Previous duplex ultrasound of the carotid arteries shows <40% bilateral ICA stenosis, tortuous ICA noted. No significant change compared to study of 02/28/2022.  Current Meds  Medication Sig   acetaminophen (TYLENOL) 500 MG tablet Take 500 mg by mouth every 6 (six) hours as needed.   amphetamine-dextroamphetamine (ADDERALL) 30 MG tablet Take 30 mg by mouth.   apixaban (ELIQUIS) 5 MG TABS tablet Take 5 mg by mouth 2 (two) times daily.   aspirin EC 81 MG tablet Take by mouth.   benzonatate (TESSALON) 100 MG capsule Take 2 capsules (200 mg total) by mouth every 8 (eight) hours.   Calcium Carbonate-Vitamin D (CALCIUM 500/D PO) Take 500 mg by mouth.   Cholecalciferol 25 MCG (1000 UT) tablet Take by mouth.   clorazepate (TRANXENE) 15 MG tablet 7.5 mg.    clotrimazole (LOTRIMIN) 1 % cream    Cranberry 450 MG TABS Take by mouth as needed.   diclofenac sodium (VOLTAREN) 1 % GEL APPLY TOPICALLY ON LEG FOUR TIMES A DAY AS NEEDED   Diethylpropion HCl CR 75 MG TB24 Take 1 tablet by mouth 3 (three) times daily.   escitalopram (LEXAPRO) 20 MG tablet Take by mouth.    estradiol (ESTRACE) 0.1 MG/GM vaginal cream Place vaginally.   fexofenadine (ALLEGRA) 180 MG tablet Take by mouth.   fluticasone (FLONASE) 50 MCG/ACT nasal spray Place into the nose.   hydrocortisone (ANUSOL-HC) 2.5 % rectal cream SMARTSIG:Rectally 3 Times Daily PRN   hydrocortisone 2.5 % cream Place rectally.   lactulose (CHRONULAC) 10 GM/15ML solution prn as needed   lamoTRIgine (LAMICTAL) 100 MG tablet    magnesium oxide (MAG-OX) 400 MG tablet Take by mouth.   naproxen (NAPROSYN) 500 MG tablet Take 500 mg by mouth.   omeprazole (PRILOSEC) 20 MG capsule Take by mouth.   ondansetron (ZOFRAN) 4 MG tablet Take by mouth.   PROCTOSOL HC 2.5 % rectal cream APPLY TO AFFECTED AREA 3 TIMES A DAY   promethazine (PHENERGAN) 25 MG tablet Take by mouth.   promethazine-dextromethorphan (PROMETHAZINE-DM) 6.25-15 MG/5ML syrup Take 5 mLs by mouth 4 (four) times daily as needed.   QUEtiapine (SEROQUEL) 50 MG tablet Take 50-100 mg by mouth at bedtime.   raloxifene (EVISTA) 60 MG tablet raloxifene 60 mg tablet   ramelteon (ROZEREM) 8 MG tablet Reported on 04/03/2016   risperiDONE (RISPERDAL) 0.25 MG tablet Take by mouth.   simvastatin (ZOCOR) 20 MG tablet Take by mouth.   solifenacin (VESICARE) 5 MG tablet Take 5 mg by mouth daily.   traMADol (ULTRAM) 50 MG tablet tramadol 50 mg tablet  UNABLE TO FIND Med Name: estradiol .1mg /ml vb cream  INSERT 4 CLICKS (= 1ML) VAGINALLY AT BEDTIME TWO TIMES PER WEEK   ziprasidone (GEODON) 40 MG capsule 80 mg. Pt takes 2 tablets daily   zonisamide (ZONEGRAN) 100 MG capsule 2 (two) times daily.    Past Medical History:  Diagnosis Date   Carotid artery occlusion    History of depression    Hyperlipidemia    Hypertension    Osteoporosis     Past Surgical History:  Procedure Laterality Date   ABDOMINAL HYSTERECTOMY     CHOLECYSTECTOMY     FOOT SURGERY Left    LEG SURGERY Left    TONSILLECTOMY     WRIST SURGERY Left     Social History Social History    Tobacco Use   Smoking status: Former    Years: 1    Types: Cigarettes    Quit date: 06/05/1967    Years since quitting: 55.8   Smokeless tobacco: Never  Vaping Use   Vaping Use: Never used  Substance Use Topics   Alcohol use: Yes    Comment: ocassionally   Drug use: Never    Family History Family History  Problem Relation Age of Onset   Hypertension Mother    Stroke Father    Heart attack Father     Allergies  Allergen Reactions   Atorvastatin     Other reaction(s): Muscle Pain   Risperidone Other (See Comments)    Other reaction(s): Other (See Comments) Hallucinations Other Reaction: Hallucinations Hallucinations Other Reaction: Hallucinations Hallucinations    Diclofenac Other (See Comments)    Other reaction(s): Other (See Comments) Other Reaction: patch = nausea Other Reaction: patch = nausea    Prednisone Other (See Comments)    Other reaction(s): Other (See Comments) Mood Change Mood Change Anxiety    Codeine Itching   Rizatriptan Palpitations     REVIEW OF SYSTEMS (Negative unless checked)  Constitutional: [] Weight loss  [] Fever  [] Chills Cardiac: [] Chest pain   [] Chest pressure   [] Palpitations   [] Shortness of breath when laying flat   [] Shortness of breath with exertion. Vascular:  [] Pain in legs with walking   [] Pain in legs at rest  [] History of DVT   [] Phlebitis   [] Swelling in legs   [] Varicose veins   [] Non-healing ulcers Pulmonary:   [] Uses home oxygen   [] Productive cough   [] Hemoptysis   [] Wheeze  [] COPD   [] Asthma Neurologic:  [] Dizziness   [] Seizures   [] History of stroke   [] History of TIA  [] Aphasia   [] Vissual changes   [] Weakness or numbness in arm   [] Weakness or numbness in leg Musculoskeletal:   [] Joint swelling   [] Joint pain   [] Low back pain Hematologic:  [] Easy bruising  [] Easy bleeding   [] Hypercoagulable state   [] Anemic Gastrointestinal:  [] Diarrhea   [] Vomiting  [x] Gastroesophageal reflux/heartburn   [] Difficulty  swallowing. Genitourinary:  [] Chronic kidney disease   [] Difficult urination  [] Frequent urination   [] Blood in urine Skin:  [] Rashes   [] Ulcers  Psychological:  [] History of anxiety   []  History of major depression.  Physical Examination  Vitals:   03/19/23 1023  BP: 109/61  Pulse: (!) 56  Resp: 16  Weight: 176 lb (79.8 kg)   Body mass index is 28.41 kg/m. Gen: WD/WN, NAD Head: [place]/AT, No temporalis wasting.  Ear/Nose/Throat: Hearing grossly intact, nares w/o erythema or drainage Eyes: PER, EOMI, sclera nonicteric.  Neck: Supple, no large masses.   Pulmonary:  Good  air movement, no audible wheezing bilaterally, no use of accessory muscles.  Cardiac: RRR, no JVD Vascular: carotid bruits Vessel Right Left  Radial Palpable Palpable  Carotid Palpable Palpable  Gastrointestinal: Non-distended. No guarding/no peritoneal signs.  Musculoskeletal: M/S 5/5 throughout.  No deformity or atrophy.  Neurologic: CN 2-12 intact. Symmetrical.  Speech is fluent. Motor exam as listed above. Psychiatric: Judgment intact, Mood & affect appropriate for pt's clinical situation. Dermatologic: No rashes or ulcers noted.  No changes consistent with cellulitis. Lymph : No lichenification or skin changes of chronic lymphedema.  CBC No results found for: "WBC", "HGB", "HCT", "MCV", "PLT"  BMET No results found for: "NA", "K", "CL", "CO2", "GLUCOSE", "BUN", "CREATININE", "CALCIUM", "GFRNONAA", "GFRAA" CrCl cannot be calculated (No successful lab value found.).  COAG No results found for: "INR", "PROTIME"  Radiology VAS US CAROTID  Result Date: 03/19/2023 Carotid Arterial Duplex Study Patient Name:  TASHEA RAMSEYER  Date of Exam:   03/19/2023 Medical Rec #: [id number]    Accession #:    [id number] Date of Birth: Sep 25, 1946    Patient Gender: F Patient Age:   89 years Exam Location:  [facility] Procedure:      VAS US CAROTID Referring Phys: Hortencia Pilar  --------------------------------------------------------------------------------  Indications:       Carotid artery disease. Comparison Study:  03/20/2022; Essentially no change from prior study. Performing Technologist: Almira Coaster RVS  Examination Guidelines: A complete evaluation includes B-mode imaging, spectral Doppler, color Doppler, and power Doppler as needed of all accessible portions of each vessel. Bilateral testing is considered an integral part of a complete examination. Limited examinations for reoccurring indications may be performed as noted.  Right Carotid Findings: +----------+--------+--------+--------+------------------+--------+           PSV cm/sEDV cm/sStenosisPlaque DescriptionComments +----------+--------+--------+--------+------------------+--------+ CCA Prox  92      16                                         +----------+--------+--------+--------+------------------+--------+ CCA Mid   93      18                                         +----------+--------+--------+--------+------------------+--------+ CCA Distal83      16                                         +----------+--------+--------+--------+------------------+--------+ ICA Prox  93      17                                         +----------+--------+--------+--------+------------------+--------+ ICA Mid   76      14                                         +----------+--------+--------+--------+------------------+--------+ ICA Distal82      21                                         +----------+--------+--------+--------+------------------+--------+  ECA       78      6                                          +----------+--------+--------+--------+------------------+--------+ +----------+--------+-------+--------+-------------------+           PSV cm/sEDV cmsDescribeArm Pressure (mmHG) +----------+--------+-------+--------+-------------------+ [id number]     0                                   +----------+--------+-------+--------+-------------------+ +---------+--------+--+--------+--+ VertebralPSV cm/s55EDV cm/s14 +---------+--------+--+--------+--+  Left Carotid Findings: +----------+--------+--------+--------+------------------+--------+           PSV cm/sEDV cm/sStenosisPlaque DescriptionComments +----------+--------+--------+--------+------------------+--------+ CCA Prox  103     18                                         +----------+--------+--------+--------+------------------+--------+ CCA Mid   119     16                                         +----------+--------+--------+--------+------------------+--------+ CCA Distal105     23                                         +----------+--------+--------+--------+------------------+--------+ ICA Prox  58      14                                         +----------+--------+--------+--------+------------------+--------+ ICA Mid   65      20                                         +----------+--------+--------+--------+------------------+--------+ ICA Distal70      12                                         +----------+--------+--------+--------+------------------+--------+ ECA       94      7                                          +----------+--------+--------+--------+------------------+--------+ +----------+--------+--------+--------+-------------------+           PSV cm/sEDV cm/sDescribeArm Pressure (mmHG) +----------+--------+--------+--------+-------------------+ Subclavian110     0                                   +----------+--------+--------+--------+-------------------+ +---------+--------+--+--------+--+ VertebralPSV cm/s74EDV cm/s17 +---------+--------+--+--------+--+   Summary: Right Carotid: Velocities in the right ICA are consistent with a 1-39% stenosis. Left Carotid: Velocities in the left ICA are consistent with a  1-39% stenosis. Vertebrals:  Bilateral vertebral arteries demonstrate antegrade flow. Subclavians: Normal flow  hemodynamics were seen in bilateral subclavian              arteries. *See table(s) above for measurements and observations.  Electronically signed by Hortencia Pilar MD on 03/19/2023 at 4:36:12 PM.    Final      Assessment/Plan 1. Bilateral carotid artery stenosis Recommend:   Given the patient's asymptomaticcarotid stenosis no  invasive testing or surgery at this time.   Previous duplex ultrasound shows <40% stenosis bilaterally.   Patient is overdue for her follow-up ultrasound and this will be ordered.  She will follow-up with me to review the results.   Continue antiplatelet therapy as prescribed Continue management of CAD, HTN and Hyperlipidemia Healthy heart diet,  encouraged exercise at least 4 times per week Follow up in 12 months with duplex ultrasound and physical exam   - VAS US CAROTID; Future  2. Benign essential hypertension Continue antihypertensive medications as already ordered, these medications have been reviewed and there are no changes at this time.   3. Gastroesophageal reflux disease without esophagitis Continue PPI as already ordered, this medication has been reviewed and there are no changes at this time.  Avoidence of caffeine and alcohol  Moderate elevation of the head of the bed   4. Hyperlipidemia, unspecified hyperlipidemia type Continue statin as ordered and reviewed, no changes at this time    Kris Hartmann, NP  03/19/2023 11:00 PM

## 2023-08-23 ENCOUNTER — Emergency Department
Admission: EM | Admit: 2023-08-23 | Discharge: 2023-08-23 | Disposition: A | Discharge status: Home / Self Care | Payer: Medicare Other | Attending: Emergency Medicine | Admitting: Emergency Medicine

## 2023-08-23 ENCOUNTER — Emergency Department: Payer: Medicare Other

## 2023-08-23 ENCOUNTER — Other Ambulatory Visit: Payer: Self-pay

## 2023-08-23 DIAGNOSIS — I10 Essential (primary) hypertension: Secondary | ICD-10-CM | POA: Diagnosis not present

## 2023-08-23 DIAGNOSIS — M1712 Unilateral primary osteoarthritis, left knee: Secondary | ICD-10-CM | POA: Diagnosis not present

## 2023-08-23 DIAGNOSIS — W01198A Fall on same level from slipping, tripping and stumbling with subsequent striking against other object, initial encounter: Secondary | ICD-10-CM | POA: Insufficient documentation

## 2023-08-23 DIAGNOSIS — Y9301 Activity, walking, marching and hiking: Secondary | ICD-10-CM | POA: Diagnosis not present

## 2023-08-23 DIAGNOSIS — Z7901 Long term (current) use of anticoagulants: Secondary | ICD-10-CM | POA: Insufficient documentation

## 2023-08-23 DIAGNOSIS — S80212A Abrasion, left knee, initial encounter: Secondary | ICD-10-CM | POA: Insufficient documentation

## 2023-08-23 DIAGNOSIS — M19042 Primary osteoarthritis, left hand: Secondary | ICD-10-CM | POA: Diagnosis not present

## 2023-08-23 DIAGNOSIS — S60512A Abrasion of left hand, initial encounter: Secondary | ICD-10-CM | POA: Insufficient documentation

## 2023-08-23 DIAGNOSIS — S0083XA Contusion of other part of head, initial encounter: Secondary | ICD-10-CM | POA: Insufficient documentation

## 2023-08-23 DIAGNOSIS — S80211A Abrasion, right knee, initial encounter: Secondary | ICD-10-CM | POA: Diagnosis not present

## 2023-08-23 DIAGNOSIS — S0990XA Unspecified injury of head, initial encounter: Secondary | ICD-10-CM | POA: Diagnosis present

## 2023-08-23 DIAGNOSIS — W19XXXA Unspecified fall, initial encounter: Secondary | ICD-10-CM

## 2023-08-23 DIAGNOSIS — T07XXXA Unspecified multiple injuries, initial encounter: Secondary | ICD-10-CM

## 2023-08-23 NOTE — ED Triage Notes (Addendum)
Arrives via [facility]  Tripped and fell while walking into the eye doctor.  C/O left hand pain, 5th finger.  Abrasion to forehead.  Takes Eliquis.  NO LOC.  VS wnl. Abrasion to left knee, left forehead.  Bleeding controlled.  Bruising to left forehead.  NAD

## 2023-08-23 NOTE — Discharge Instructions (Addendum)
Your evaluated in the ED today for fall and multiple abrasions to your knee and forehead.  You will need to keep these areas clean with soap and water.  Use Neosporin after cleansing.  Daily dressing changes are recommended.  Your imaging today is normal.  Take Tylenol for pain as needed.  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  If pain develops in your neck or symptoms worsen follow-up with neurosurgery for further evaluation.  As always you are more than welcome to return to the ED for medical necessity needed.  Is advised to follow-up with your primary care to review this ED visit for them to stay up-to-date with your care.

## 2023-08-23 NOTE — ED Provider Notes (Signed)
[facility] Emergency Department Provider Note     Event Date/Time   First MD Initiated Contact with Patient 08/23/23 1108     (approximate)   History   Fall   HPI  Andrea Sims is a 77 y.o. female with a history of hypertension, osteoporosis, HLD presents to the ED EMS following a fall.  Patient reports she was on her way to the eye doctor and tripped over a curb falling forward making contact to the concrete ground.  Patient endorses hitting her head.  Denies LOC and vomiting.  Patient has multiple scrapes over her forehead, left hand and knees.  Noted swelling to her left knee.  Patient reports pain is mild with a pain score of 2/10.  Endorses she is on Eliquis.  Denies headache, confusion, visual changes, back pain, chest pain, and shortness of breath.    Physical Exam   Triage Vital Signs: ED Triage Vitals  Encounter Vitals Group     BP 08/23/23 1052 (!) 162/144     Systolic BP Percentile --      Diastolic BP Percentile --      Pulse Rate 08/23/23 1052 82     Resp 08/23/23 1052 18     Temp 08/23/23 1052 98.4 F (36.9 C)     Temp src --      SpO2 08/23/23 1052 100 %     Weight 08/23/23 1037 175 lb 14.8 oz (79.8 kg)     Height 08/23/23 1037 5\' 6"  (1.676 m)     Head Circumference --      Peak Flow --      Pain Score 08/23/23 1036 4     Pain Loc --      Pain Education --      Exclude from Growth Chart --     Most recent vital signs: Vitals:   08/23/23 1052 08/23/23 1343  BP: (!) 162/144 (!) 127/57  Pulse: 82 (!) 57  Resp: 18 16  Temp: 98.4 F (36.9 C)   SpO2: 100% 97%   General: Alert and oriented. INAD.     Head:  NCAT.  Nontender scalp.  Abrasion over left forehead.  Mild hematoma noted over forehead.  Tender to palpation. Eyes:  PERRLA. EOMI.  Ears:  No preauricular ecchymosis. Nose:   Mucosa is moist. No rhinorrhea.  Nasal septum is midline. Neck:   No cervical spine tenderness to palpation. Full ROM without difficulty.   CV:  Good peripheral perfusion. RRR. No peripheral edema.  RESP:  Normal effort. LCTAB. No retractions.  No wheezes and no rhonchi. ABD:  No distention.  MSK:   Bilateral tremors of hands (baseline per patient)   Right knee: No visible deformity.  Band-Aid placed over small abrasion.  Nontender to palpation.  Full AROM.  Left knee: Moderate edema noted.  Large abrasion.  No cellulitic skin changes.  No warmth.  Full AROM.  TTP over superior medial border of patella.  Left hand: Pea sized abrasion over 5th MCP joint of the palmar aspect with mild tenderness.  bleeding is controlled.  Full AROM of all 5 digits.  Neurovascular status intact.  Radial pulses palpated NEURO: Cranial nerves II-XII intact. No focal deficits. Sensation and motor function intact. 4/5 muscle strength of UE & LE. Gait is steady.   ED Results / Procedures / Treatments   Labs (all labs ordered are listed, but only abnormal results are displayed) Labs Reviewed - No data to display  RADIOLOGY  I personally viewed and evaluated these images as part of my medical decision making, as well as reviewing the written report by the radiologist.  ED Provider Interpretation: CT head and CT cervical spine are unremarkable of acute abnormalities.  There is moderate osteoarthritis noted.  No fracture or dislocation of the left hand x-ray  Left knee x-ray is unremarkable of acute fracture or dislocation.  CT Head Wo Contrast  Result Date: 08/23/2023 CLINICAL DATA:  Provided history: Neck trauma. Head trauma, minor. Additional history provided: Fall, abrasion to forehead. On Eliquis. Left hand pain. EXAM: CT HEAD WITHOUT CONTRAST CT CERVICAL SPINE WITHOUT CONTRAST TECHNIQUE: Multidetector CT imaging of the head and cervical spine was performed following the standard protocol without intravenous contrast. Multiplanar CT image reconstructions of the cervical spine were also generated. RADIATION DOSE REDUCTION: This exam was performed  according to the departmental dose-optimization program which includes automated exposure control, adjustment of the mA and/or kV according to patient size and/or use of iterative reconstruction technique. COMPARISON:  None. FINDINGS: CT HEAD FINDINGS Brain: No age advanced or lobar predominant parenchymal atrophy. Small chronic infarct within the right cerebellar hemisphere. There is no acute intracranial hemorrhage. No demarcated cortical infarct. No extra-axial fluid collection. No evidence of an intracranial mass. No midline shift. Vascular: No hyperdense vessel.  Atherosclerotic calcifications Skull: No calvarial fracture or aggressive osseous lesion. Sinuses/Orbits: No mass or acute finding within the imaged orbits. No significant paranasal sinus disease at the imaged levels. CT CERVICAL SPINE FINDINGS Alignment: Mild grade 1 anterolisthesis at C3-C4 and C4-C5. Skull base and vertebrae: The basion-dental and atlanto-dental intervals are maintained.No evidence of acute fracture to the cervical spine. Soft tissues and spinal canal: No prevertebral fluid or swelling. No visible canal hematoma. Disc levels: Cervical spondylosis with multilevel disc space narrowing, disc bulges/central disc protrusions, endplate spurring, uncovertebral hypertrophy and facet arthrosis. Disc space narrowing is greatest at C4-C5, C5-C6 and C6-C7 (advanced at these levels). No appreciable high-grade spinal canal stenosis. Multilevel bony neural foraminal narrowing. 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 Degenerative changes also present at the C1-C2 articulation. Upper chest: No consolidation within the imaged lung apices. No visible pneumothorax. IMPRESSION: CT head: 1. No evidence of an acute intracranial abnormality. 2. Small chronic infarct within the right cerebellar hemisphere. CT cervical spine: 1. No evidence of an acute cervical spine fracture. 2. Mild grade 1 anterolisthesis at C3-C4 and C4-C5. 3.  Cervical spondylosis as described. 4. 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 Electronically Signed   By: Jackey Loge D.O.   On: 08/23/2023 12:47   CT Cervical Spine Wo Contrast  Result Date: 08/23/2023 CLINICAL DATA:  Provided history: Neck trauma. Head trauma, minor. Additional history provided: Fall, abrasion to forehead. On Eliquis. Left hand pain. EXAM: CT HEAD WITHOUT CONTRAST CT CERVICAL SPINE WITHOUT CONTRAST TECHNIQUE: Multidetector CT imaging of the head and cervical spine was performed following the standard protocol without intravenous contrast. Multiplanar CT image reconstructions of the cervical spine were also generated. RADIATION DOSE REDUCTION: This exam was performed according to the departmental dose-optimization program which includes automated exposure control, adjustment of the mA and/or kV according to patient size and/or use of iterative reconstruction technique. COMPARISON:  None. FINDINGS: CT HEAD FINDINGS Brain: No age advanced or lobar predominant parenchymal atrophy. Small chronic infarct within the right cerebellar hemisphere. There is no acute intracranial hemorrhage. No demarcated cortical infarct. No extra-axial fluid collection. No evidence of an intracranial  mass. No midline shift. Vascular: No hyperdense vessel.  Atherosclerotic calcifications Skull: No calvarial fracture or aggressive osseous lesion. Sinuses/Orbits: No mass or acute finding within the imaged orbits. No significant paranasal sinus disease at the imaged levels. CT CERVICAL SPINE FINDINGS Alignment: Mild grade 1 anterolisthesis at C3-C4 and C4-C5. Skull base and vertebrae: The basion-dental and atlanto-dental intervals are maintained.No evidence of acute fracture to the cervical spine. Soft tissues and spinal canal: No prevertebral fluid or swelling. No visible canal hematoma. Disc levels: Cervical spondylosis with multilevel disc space narrowing, disc bulges/central disc protrusions,  endplate spurring, uncovertebral hypertrophy and facet arthrosis. Disc space narrowing is greatest at C4-C5, C5-C6 and C6-C7 (advanced at these levels). No appreciable high-grade spinal canal stenosis. Multilevel bony neural foraminal narrowing. 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 Degenerative changes also present at the C1-C2 articulation. Upper chest: No consolidation within the imaged lung apices. No visible pneumothorax. IMPRESSION: CT head: 1. No evidence of an acute intracranial abnormality. 2. Small chronic infarct within the right cerebellar hemisphere. CT cervical spine: 1. No evidence of an acute cervical spine fracture. 2. Mild grade 1 anterolisthesis at C3-C4 and C4-C5. 3. Cervical spondylosis as described. 4. 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 Electronically Signed   By: Jackey Loge D.O.   On: 08/23/2023 12:47   DG Knee Complete 4 Views Left  Result Date: 08/23/2023 CLINICAL DATA:  Status post fall today. EXAM: LEFT KNEE - COMPLETE 4+ VIEW COMPARISON:  None Available. FINDINGS: Partial visualization of femoral long intramedullary nail fixation with single distal interlocking screw. Two vertical approach screws fixates a remote fracture of the patella. There is moderate inferior patellar pole elongation, likely from chronic traction changes. Tiny joint effusion. Mild soft tissue swelling anterior to the inferior patella. No acute fracture or dislocation. Mild-to-moderate medial and lateral acromion chondrocalcinosis. Moderate to severe patellofemoral joint space narrowing and peripheral osteophytosis. Mild peripheral medial chronic degenerative spurring. The medial and lateral compartment joint spaces appear maintained. IMPRESSION: 1. Status post patellar ORIF. 2. No acute fracture or dislocation. 3. 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 4. Tiny joint effusion. Electronically Signed   By: Neita Garnet M.D.   On: 08/23/2023 12:18    DG Hand Complete Left  Result Date: 08/23/2023 CLINICAL DATA:  Status post fall today. EXAM: LEFT HAND - COMPLETE 3+ VIEW COMPARISON:  None Available. FINDINGS: There is diffuse decreased bone mineralization. Partial visualization of for plate and screw fixation of the distal radius. Moderate to severe thumb carpometacarpal and moderate triscaphe joint space narrowing, subchondral sclerosis, and peripheral osteophytosis. A chronic ossicle is seen at the lateral aspect of the thumb carpometacarpal joint. Mild-to-moderate thumb interphalangeal and second through fifth digit interphalangeal joint space narrowing, subchondral sclerosis, and peripheral osteophytosis, greatest within the second through fourth PIP joints. No acute fracture or dislocation. IMPRESSION: 1. No acute fracture or dislocation. 2. 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 3. Mild-to-moderate thumb interphalangeal and second through fifth digit interphalangeal osteoarthritis. Electronically Signed   By: Neita Garnet M.D.   On: 08/23/2023 12:16    PROCEDURES:  Critical Care performed: No  Procedures  MEDICATIONS ORDERED IN ED: Medications - No data to display  IMPRESSION / MDM / ASSESSMENT AND PLAN / ED COURSE  I reviewed the triage vital signs and the nursing notes.  Clinical Course as of 08/23/23 2157  Thu Aug 23, 2023  1144 Patient opt out of pain medication.  [MH]    Clinical Course User Index [MH] Kern Reap A, PA-C   77 y.o. female presents to the emergency department for evaluation and treatment of a fall sustaining multiple abrasions to multiple sites of her body. See HPI for further details.   Differential diagnosis includes, but is not limited to fracture, dislocation, intracranial hemorrhage, skull fracture, cervical injury.  Patient's presentation is most consistent with acute complicated illness / injury requiring diagnostic  workup.  Patient is alert and oriented.  She is speaking in complete sentences and answering questions appropriately.  She is hemodynamically stable.  Physical exam findings are as stated above.  Overall benign with the exception of multiple abrasions.  Plan for wound care following imaging.  Given mechanism of injury and active anticoagulation use, CT head and cervical is indicated.  The patient's left knee is tender to palpation on exam.  Knee x-ray is reassuring of fracture or dislocation status post ORIF.  CT is reassuring there is no intracranial hemorrhage or skull fracture noted.  There is mild degenerative changes indicating osteoarthritis.  Patient is in for which is not a new finding.  Overall patient is stable condition for discharge home.  Dressings applied to left forehead abrasion, left knee abrasion and right knee.  Gauze and dressing pack provided for patient to take at home.  Encouraged use Neosporin over her abrasions.  I encouraged her at any time if she develops the symptoms of neck pain that she should follow-up with neurosurgery as needed otherwise follow-up with her primary care to weeks to have. Patient is given ED precautions to return to the ED for any worsening or new symptoms. Patient verbalizes understanding. All questions and concerns were addressed during ED visit.     FINAL CLINICAL IMPRESSION(S) / ED DIAGNOSES   Final diagnoses:  Fall, initial encounter  Abrasions of multiple sites   Rx / DC Orders   ED Discharge Orders     None        Note:  This document was prepared using Dragon voice recognition software and may include unintentional dictation errors.    Romeo Apple, Samaria Anes A, PA-C 08/23/23 2157    Chesley Noon, MD 08/24/23 [phone]

## 2024-03-21 ENCOUNTER — Other Ambulatory Visit (INDEPENDENT_AMBULATORY_CARE_PROVIDER_SITE_OTHER): Payer: Self-pay | Admitting: Vascular Surgery

## 2024-03-21 DIAGNOSIS — I6523 Occlusion and stenosis of bilateral carotid arteries: Secondary | ICD-10-CM

## 2024-03-23 NOTE — Progress Notes (Deleted)
 MRN : [id number]  Andrea Sims is a 78 y.o. (November 05, 1946) female who presents with chief complaint of check carotid arteries.  History of Present Illness:   The patient is seen for follow up evaluation of carotid stenosis. The carotid stenosis followed by ultrasound.    The patient denies amaurosis fugax. There is no recent history of TIA symptoms or focal motor deficits. There is no prior documented CVA.   The patient is taking enteric-coated aspirin 81 mg daily.   There is no history of migraine headaches. There is no history of seizures.   The patient has a history of coronary artery disease, no recent episodes of angina or shortness of breath. The patient denies PAD or claudication symptoms. There is a history of hyperlipidemia which is being treated with a statin.     Previous duplex ultrasound of the carotid arteries shows <40% bilateral ICA stenosis, tortuous ICA noted. No significant change compared to study of 02/28/2022.  No outpatient medications have been marked as taking for the 03/24/24 encounter (Appointment) with Gilda Crease, Latina Craver, MD.    Past Medical History:  Diagnosis Date   Carotid artery occlusion    History of depression    Hyperlipidemia    Hypertension    Osteoporosis     Past Surgical History:  Procedure Laterality Date   ABDOMINAL HYSTERECTOMY     CHOLECYSTECTOMY     FOOT SURGERY Left    LEG SURGERY Left    TONSILLECTOMY     WRIST SURGERY Left     Social History Social History   Tobacco Use   Smoking status: Former    Current packs/day: 0.00    Types: Cigarettes    Start date: 06/04/1966    Quit date: 06/05/1967    Years since quitting: 56.8   Smokeless tobacco: Never  Vaping Use   Vaping status: Never Used  Substance Use Topics   Alcohol use: Yes    Comment: ocassionally   Drug use: Never    Family History Family History  Problem Relation Age of Onset   Hypertension Mother    Stroke Father    Heart  attack Father     Allergies  Allergen Reactions   Atorvastatin     Other reaction(s): Muscle Pain   Risperidone Other (See Comments)    Other reaction(s): Other (See Comments) Hallucinations Other Reaction: Hallucinations Hallucinations Other Reaction: Hallucinations Hallucinations    Diclofenac Other (See Comments)    Other reaction(s): Other (See Comments) Other Reaction: patch = nausea Other Reaction: patch = nausea    Prednisone Other (See Comments)    Other reaction(s): Other (See Comments) Mood Change Mood Change Anxiety    Codeine Itching   Rizatriptan Palpitations     REVIEW OF SYSTEMS (Negative unless checked)  Constitutional: [] Weight loss  [] Fever  [] Chills Cardiac: [] Chest pain   [] Chest pressure   [] Palpitations   [] Shortness of breath when laying flat   [] Shortness of breath with exertion. Vascular:  [x] Pain in legs with walking   [] Pain in legs at rest  [] History of DVT   [] Phlebitis   [] Swelling in legs   [] Varicose veins   [] Non-healing ulcers Pulmonary:   [] Uses home oxygen   [] Productive cough   [] Hemoptysis   [] Wheeze  [] COPD   [] Asthma Neurologic:  [] Dizziness   [] Seizures   [] History of stroke   []   History of TIA  [] Aphasia   [] Vissual changes   [] Weakness or numbness in arm   [] Weakness or numbness in leg Musculoskeletal:   [] Joint swelling   [] Joint pain   [] Low back pain Hematologic:  [] Easy bruising  [] Easy bleeding   [] Hypercoagulable state   [] Anemic Gastrointestinal:  [] Diarrhea   [] Vomiting  [] Gastroesophageal reflux/heartburn   [] Difficulty swallowing. Genitourinary:  [] Chronic kidney disease   [] Difficult urination  [] Frequent urination   [] Blood in urine Skin:  [] Rashes   [] Ulcers  Psychological:  [] History of anxiety   []  History of major depression.  Physical Examination  There were no vitals filed for this visit. There is no height or weight on file to calculate BMI. Gen: WD/WN, NAD Head: [place]/AT, No temporalis wasting.   Ear/Nose/Throat: Hearing grossly intact, nares w/o erythema or drainage Eyes: PER, EOMI, sclera nonicteric.  Neck: Supple, no masses.  No bruit or JVD.  Pulmonary:  Good air movement, no audible wheezing, no use of accessory muscles.  Cardiac: RRR, normal S1, S2, no Murmurs. Vascular:  carotid bruit noted Vessel Right Left  Radial Palpable Palpable  Carotid  Palpable  Palpable  Subclav  Palpable Palpable  Gastrointestinal: soft, non-distended. No guarding/no peritoneal signs.  Musculoskeletal: M/S 5/5 throughout.  No visible deformity.  Neurologic: CN 2-12 intact. Pain and light touch intact in extremities.  Symmetrical.  Speech is fluent. Motor exam as listed above. Psychiatric: Judgment intact, Mood & affect appropriate for pt's clinical situation. Dermatologic: No rashes or ulcers noted.  No changes consistent with cellulitis.   CBC No results found for: "WBC", "HGB", "HCT", "MCV", "PLT"  BMET No results found for: "NA", "K", "CL", "CO2", "GLUCOSE", "BUN", "CREATININE", "CALCIUM", "GFRNONAA", "GFRAA" CrCl cannot be calculated (No successful lab value found.).  COAG No results found for: "INR", "PROTIME"  Radiology No results found.   Assessment/Plan There are no diagnoses linked to this encounter.   Levora Dredge, MD  03/23/2024 3:48 PM

## 2024-03-24 ENCOUNTER — Ambulatory Visit (INDEPENDENT_AMBULATORY_CARE_PROVIDER_SITE_OTHER): Payer: Medicare Other | Admitting: Vascular Surgery

## 2024-03-24 ENCOUNTER — Encounter (INDEPENDENT_AMBULATORY_CARE_PROVIDER_SITE_OTHER): Payer: Medicare Other

## 2024-03-24 DIAGNOSIS — E785 Hyperlipidemia, unspecified: Secondary | ICD-10-CM

## 2024-03-24 DIAGNOSIS — K219 Gastro-esophageal reflux disease without esophagitis: Secondary | ICD-10-CM

## 2024-03-24 DIAGNOSIS — I10 Essential (primary) hypertension: Secondary | ICD-10-CM

## 2024-03-24 DIAGNOSIS — I6523 Occlusion and stenosis of bilateral carotid arteries: Secondary | ICD-10-CM

## 2024-05-13 ENCOUNTER — Encounter (INDEPENDENT_AMBULATORY_CARE_PROVIDER_SITE_OTHER): Payer: Self-pay
